# Patient Record
Sex: Female | Born: 1985
Health system: Southern US, Community
[De-identification: ages and names within clinical notes are randomized; demographics above are authoritative.]

## PROBLEM LIST (undated history)

## (undated) DIAGNOSIS — J9801 Acute bronchospasm: Secondary | ICD-10-CM

## (undated) DIAGNOSIS — T7840XA Allergy, unspecified, initial encounter: Secondary | ICD-10-CM

## (undated) DIAGNOSIS — F329 Major depressive disorder, single episode, unspecified: Secondary | ICD-10-CM

## (undated) DIAGNOSIS — F32A Depression, unspecified: Secondary | ICD-10-CM

## (undated) HISTORY — PX: WISDOM TOOTH EXTRACTION: SHX21

## (undated) HISTORY — DX: Depression, unspecified: F32.A

## (undated) HISTORY — DX: Allergy, unspecified, initial encounter: T78.40XA

## (undated) HISTORY — DX: Acute bronchospasm: J98.01

## (undated) HISTORY — DX: Major depressive disorder, single episode, unspecified: F32.9

---

## 2009-07-04 ENCOUNTER — Ambulatory Visit (HOSPITAL_COMMUNITY): Admission: RE | Admit: 2009-07-04 | Discharge: 2009-07-04 | Payer: Self-pay | Admitting: Family Medicine

## 2011-03-06 ENCOUNTER — Ambulatory Visit (INDEPENDENT_AMBULATORY_CARE_PROVIDER_SITE_OTHER): Payer: 59

## 2011-03-06 DIAGNOSIS — Z309 Encounter for contraceptive management, unspecified: Secondary | ICD-10-CM

## 2011-03-20 ENCOUNTER — Encounter: Payer: Self-pay | Admitting: Physician Assistant

## 2011-04-20 ENCOUNTER — Ambulatory Visit (INDEPENDENT_AMBULATORY_CARE_PROVIDER_SITE_OTHER): Payer: 59 | Admitting: Physician Assistant

## 2011-04-20 ENCOUNTER — Encounter: Payer: Self-pay | Admitting: Physician Assistant

## 2011-04-20 VITALS — BP 127/73 | HR 71 | Temp 98.8°F

## 2011-04-20 DIAGNOSIS — R05 Cough: Secondary | ICD-10-CM

## 2011-04-20 DIAGNOSIS — J9801 Acute bronchospasm: Secondary | ICD-10-CM

## 2011-04-20 DIAGNOSIS — J069 Acute upper respiratory infection, unspecified: Secondary | ICD-10-CM

## 2011-04-20 MED ORDER — ALBUTEROL SULFATE HFA 108 (90 BASE) MCG/ACT IN AERS: 2.0000 | INHALATION_SPRAY | RESPIRATORY_TRACT | Status: DC | PRN

## 2011-04-20 MED ORDER — IPRATROPIUM BROMIDE 0.06 % NA SOLN: 2.0000 | Freq: Four times a day (QID) | NASAL | Status: DC

## 2011-04-20 NOTE — Progress Notes (Signed)
  Subjective:    Patient ID: Sheri Allen, female    DOB: [birth year], 26 y.o.   MRN: [id number]  HPI Sheri Allen c/o uri sx x 6 days with scratchy throat, head congestion, cough.  Cough is worse now with fits of coughing, tightness, productive cough. Has been SOB. No f/c. Social smoker   Review of Systems  Constitutional: Positive for chills. Negative for fever.  HENT: Positive for congestion and rhinorrhea. Negative for sore throat.   Respiratory: Positive for cough and shortness of breath.   Musculoskeletal: Negative for myalgias.       Objective:   Physical Exam  Constitutional: She appears well-developed and well-nourished.  HENT:  Right Ear: Tympanic membrane normal.  Left Ear: Tympanic membrane normal.  Mouth/Throat: Oropharynx is clear and moist.  Cardiovascular: Normal rate and regular rhythm.   Pulmonary/Chest: She has wheezes in the left lower field. She has rhonchi in the left lower field.   Albuterol nebulizer treatment administered. Pt feels relief.  PF increases to 400.       Assessment & Plan:  URI Cough Bronchspasm  Atrovent NS and ProAir called in.  Push fluids and return if productive cough, fever, SOB.

## 2011-04-21 ENCOUNTER — Ambulatory Visit (INDEPENDENT_AMBULATORY_CARE_PROVIDER_SITE_OTHER): Payer: 59 | Admitting: Physician Assistant

## 2011-04-21 ENCOUNTER — Encounter: Payer: Self-pay | Admitting: Physician Assistant

## 2011-04-21 DIAGNOSIS — R05 Cough: Secondary | ICD-10-CM

## 2011-04-21 MED ORDER — ALBUTEROL SULFATE (2.5 MG/3ML) 0.083% IN NEBU: 2.5000 mg | INHALATION_SOLUTION | Freq: Once | RESPIRATORY_TRACT | Status: DC

## 2011-04-21 NOTE — Progress Notes (Signed)
    Patient ID: Sheri Allen MRN: [id number], DOB: 11-17-1985, 26 y.o. Date of Encounter: 04/21/2011, 9:34 AM  Primary Physician: No primary provider on file.  Chief Complaint: No chief complaint on file.   HPI: 26 y.o. year old female presents requesting albuterol neb. She requests this secondary to not picking her albuterol inhaler up yet from the pharmacy. Overall she is feeling better, although she does complain of mild wheezing this morning with the cold air outside. No fever, chills. Social smoker.  No past medical history on file.   Home Meds: Prior to Admission medications   Medication Sig Start Date End Date Taking? Authorizing Provider  albuterol (PROVENTIL HFA;VENTOLIN HFA) 108 (90 BASE) MCG/ACT inhaler Inhale 2 puffs into the lungs every 4 (four) hours as needed for wheezing. 04/20/11 04/19/12  Kennedy Bucker, PA-C  ipratropium (ATROVENT) 0.06 % nasal spray Place 2 sprays into the nose 4 (four) times daily. 04/20/11 04/19/12  Kennedy Bucker, PA-C  medroxyPROGESTERone (DEPO-PROVERA) 150 MG/ML injection Inject 150 mg into the muscle every 3 (three) months.    Historical Provider, MD    Allergies:  Allergies  Allergen Reactions  . Amoxicillin   . Hydrocodone   . Monistat     History   Social History  . Marital Status: Married    Spouse Name: N/A    Number of Children: N/A  . Years of Education: N/A   Occupational History  . Not on file.   Social History Main Topics  . Smoking status: Current Some Day Smoker  . Smokeless tobacco: Not on file  . Alcohol Use: Not on file  . Drug Use: Not on file  . Sexually Active: Not on file   Other Topics Concern  . Not on file   Social History Narrative  . No narrative on file     Review of Systems: Constitutional: negative for chills, fever, night sweats or weight changes Cardiovascular: negative for chest pain or palpitations Respiratory: negative for hemoptysis, or shortness of breath Abdominal: negative for  abdominal pain, nausea, vomiting or diarrhea Dermatological: negative for rash Neurologic: negative for headache   Physical Exam: There were no vitals taken for this visit., There is no height or weight on file to calculate BMI. General: Well developed, well nourished, in no acute distress. Head: Normocephalic, atraumatic, eyes without discharge, sclera non-icteric, nares are congested.  Neck: Supple. No thyromegaly. Full ROM. No lymphadenopathy. Lungs: Mild wheezes bilaterally, without rales, or rhonchi. Breathing is unlabored. Heart: RRR with S1 S2. No murmurs, rubs, or gallops appreciated. Msk:  Strength and tone normal for age. Extremities: No clubbing or cyanosis. No edema. Neuro: Alert and oriented X 3. Moves all extremities spontaneously. CNII-XII grossly in tact. Psych:  Responds to questions appropriately with a normal affect.   S/p Albuterol neb: Feels a little better.    ASSESSMENT AND PLAN:  26 y.o. year old female with URI/cough/bronchospasm -Albuterol neb, declines any further treatment. Feeling much better as the day has progressed. -Mucinex -Tylenol/Motrin prn -Rest/fluids -RTC precautions -RTC 3-5 days if no improvement  Signed, Eula Listen, PA-C 04/21/2011 3:32 PM

## 2011-05-21 ENCOUNTER — Ambulatory Visit (INDEPENDENT_AMBULATORY_CARE_PROVIDER_SITE_OTHER): Payer: 59 | Admitting: Physician Assistant

## 2011-05-21 ENCOUNTER — Encounter: Payer: Self-pay | Admitting: Physician Assistant

## 2011-05-21 VITALS — BP 130/75 | HR 100 | Temp 98.4°F | Resp 16 | Ht 67.0 in | Wt 153.0 lb

## 2011-05-21 DIAGNOSIS — Z309 Encounter for contraceptive management, unspecified: Secondary | ICD-10-CM

## 2011-05-21 MED ORDER — FEXOFENADINE HCL 180 MG PO TABS: 180.0000 mg | ORAL_TABLET | Freq: Every day | ORAL | Status: DC

## 2011-05-21 MED ORDER — ORTHO TRI-CYCLEN (28) 0.18/0.215/0.25 MG-35 MCG PO TABS: 1.0000 | ORAL_TABLET | Freq: Every day | ORAL | Status: AC

## 2011-05-21 NOTE — Progress Notes (Signed)
  Subjective:    Patient ID: Sheri Allen, female    DOB: Nov 27, 1985, 26 y.o.   MRN: [id number]  HPI  Sheri Allen comes in today requesting BCP.  She would like to switch from Depo Provera.  She has been on Depo for > 1 year and states that she has spotted regularly since starting it.  She was on BCP years ago and had no problems with them.  She currently smokes 1/2 ppd.  UTD PAP  Review of Systems  Cardiovascular: Negative for leg swelling.  Genitourinary: Positive for menstrual problem (Spotting).  Neurological: Negative for headaches.       Objective:   Physical Exam  Constitutional: She is oriented to person, place, and time. She appears well-developed and well-nourished.  Neurological: She is alert and oriented to person, place, and time.          Assessment & Plan:  [facility] Planning  Ortho [facility] (brand name) Reviewed BC start F/U with GYN for annual exam

## 2011-08-06 ENCOUNTER — Encounter: Payer: Self-pay | Admitting: Family Medicine

## 2011-08-06 ENCOUNTER — Ambulatory Visit (INDEPENDENT_AMBULATORY_CARE_PROVIDER_SITE_OTHER): Payer: 59 | Admitting: Family Medicine

## 2011-08-06 VITALS — BP 111/73 | HR 75 | Temp 98.6°F | Resp 16

## 2011-08-06 DIAGNOSIS — J40 Bronchitis, not specified as acute or chronic: Secondary | ICD-10-CM

## 2011-08-06 DIAGNOSIS — J209 Acute bronchitis, unspecified: Secondary | ICD-10-CM

## 2011-08-06 MED ORDER — METHYLPREDNISOLONE ACETATE 80 MG/ML IJ SUSP
80.0000 mg | Freq: Once | INTRAMUSCULAR | Status: AC
  Administered 2011-08-06: 80 mg via INTRAMUSCULAR

## 2011-08-06 MED ORDER — PREDNISONE 50 MG PO TABS: ORAL_TABLET | ORAL | Status: AC

## 2011-08-06 MED ORDER — AZITHROMYCIN 250 MG PO TABS: ORAL_TABLET | ORAL | Status: AC

## 2011-08-06 NOTE — Progress Notes (Signed)
  Subjective:    Patient ID: Sheri Allen, female    DOB: December 24, 1985, 26 y.o.   MRN: [id number]  HPI URI Symptoms Onset: 9 days   Description: cough, wheezing, mild increased WOB, rhinorrrhea, nasal congestion, post nasal drip  Modifying factors:  + 1/2 PPD smoker   Symptoms Nasal discharge: yes Fever: no Sore throat: no Cough: yes Wheezing: yes Ear pain: no GI symptoms: no Sick contacts: no  Red Flags  Stiff neck: no Dyspnea: mild Rash: no Swallowing difficulty: no  Sinusitis Risk Factors Headache/face pain: no Double sickening: no tooth pain: no  Allergy Risk Factors Sneezing: yes Itchy scratchy throat: no Seasonal symptoms: no  Flu Risk Factors Headache: no muscle aches: no severe fatigue: no     Review of Systems See HPI, otherwise ROS negative     Objective:   Physical Exam Gen: up in chair, NAD HEENT: NCAT, EOMI, TMs clear bilaterally, +nasal erythema, rhinorrhea bilaterally, + post oropharyngeal erythema  CV: RRR, no murmurs auscultated PULM: good overall air movement, faint wheezes in apices ABD: S/NT/+ bowel sounds  EXT: 2+ peripheral pulses      Assessment & Plan:  Bronchitic flare with underlying asthma. Depomedrol 80 IM x 1. Prednisone. Azithromycin for atypical coverage. Discussed smoking cessation. Resp red flags discussed. Follow up as needed.

## 2011-09-21 ENCOUNTER — Ambulatory Visit (INDEPENDENT_AMBULATORY_CARE_PROVIDER_SITE_OTHER): Payer: 59 | Admitting: Physician Assistant

## 2011-09-21 VITALS — BP 118/72 | HR 80 | Temp 98.6°F | Resp 16

## 2011-09-21 DIAGNOSIS — M25519 Pain in unspecified shoulder: Secondary | ICD-10-CM

## 2011-09-21 MED ORDER — MELOXICAM 15 MG PO TABS: 15.0000 mg | ORAL_TABLET | Freq: Every day | ORAL | Status: DC

## 2011-09-21 MED ORDER — METHOCARBAMOL 750 MG PO TABS: 750.0000 mg | ORAL_TABLET | Freq: Three times a day (TID) | ORAL | Status: AC

## 2011-09-22 ENCOUNTER — Encounter: Payer: Self-pay | Admitting: Physician Assistant

## 2011-09-22 NOTE — Progress Notes (Signed)
  Subjective:    Patient ID: Sheri Allen, female    DOB: 04-18-1985, 26 y.o.   MRN: [id number]  HPI Sheri Allen comes in today c/o left shoulder pain for 1 day.  She states that she went swimming the day before and noticed some discomfort later.  This morning was not able to move much without significant pain.  Noticed tingling in her left arm and tightness of the muscle along top of shoulder.  She was able to reduce the pain by lying down on her back a being still.  She also took Ibuprofen that helped.  She is better now but still notices the discomfort.  No previous shoulder injury.   Review of Systems As noted in HPI, otherwise negative     Objective:   Physical Exam  Constitutional: She is oriented to person, place, and time.  Musculoskeletal:       Left shoulder: She exhibits tenderness, pain and spasm. She exhibits normal range of motion, no bony tenderness, no swelling, no effusion and no deformity.       Arms:      FROM but some discomfort with empty can test. No weakness though  Neurological: She is alert and oriented to person, place, and time.  Reflex Scores:      Bicep reflexes are 2+ on the right side and 2+ on the left side. Skin: Skin is warm.          Assessment & Plan:  Shoulder pain Paraesthesias  Encouraged resting but gentle ROM exercises Mobic and Robaxin Watch for worsening paraesthesias Recheck if worsens

## 2011-12-04 ENCOUNTER — Encounter: Payer: Self-pay | Admitting: Internal Medicine

## 2011-12-04 ENCOUNTER — Ambulatory Visit (INDEPENDENT_AMBULATORY_CARE_PROVIDER_SITE_OTHER): Payer: 59 | Admitting: Internal Medicine

## 2011-12-04 VITALS — BP 110/60 | HR 78 | Temp 98.9°F | Resp 16 | Ht 67.5 in | Wt 156.0 lb

## 2011-12-04 DIAGNOSIS — G4700 Insomnia, unspecified: Secondary | ICD-10-CM

## 2011-12-04 MED ORDER — BUPROPION HCL ER (XL) 150 MG PO TB24: ORAL_TABLET | ORAL | Status: DC

## 2011-12-04 MED ORDER — CLONAZEPAM 0.5 MG PO TABS: 0.5000 mg | ORAL_TABLET | Freq: Every evening | ORAL | Status: DC | PRN

## 2011-12-04 NOTE — Progress Notes (Signed)
  Subjective:    Patient ID: Sheri Allen, female    DOB: [birth year], 26 y.o.   MRN: [id number]  HPIProblems with insomnia that are related to a multitude of  Problems/Stressors Confidential information discussed that doesn't belong in this chart-Home/family Mind races with worry/multiple issues  Family history-both parents substance abusers  Grandfather suicide during prolonged illness   Social history-mom was 67 years old her birth  She left home after high school  Married x7 years  Smoking much more over the last 6 months as a treatment   Review of Systems No past medical problems or medications except for allergy and asthma medications and contraceptives    Objective:   Physical Exam Vital signs stable No acute distress Mood= anxious/tears Thought= stable Judgment= sound No SI      Assessment & Plan:  Problem #1 adjustment reaction with insomnia Meds ordered this encounter  Medications  . clonazePAM (KLONOPIN) 0.5 MG tablet    Sig: Take 1 tablet (0.5 mg total) by mouth at bedtime as needed.    Dispense:  30 tablet    Refill:  0  . buPROPion (WELLBUTRIN XL) 150 MG 24 hr tablet    Sig: One a day for 4 days then 2 tabs a day    Dispense:  60 tablet    Refill:  0   Needs to quit smoking/cessation discussed Referred to Evalina Field Followup 3 weeks

## 2011-12-20 ENCOUNTER — Ambulatory Visit (INDEPENDENT_AMBULATORY_CARE_PROVIDER_SITE_OTHER): Payer: 59 | Admitting: Physician Assistant

## 2011-12-20 VITALS — BP 108/82 | HR 60 | Temp 99.5°F | Resp 16 | Ht 67.0 in | Wt 155.0 lb

## 2011-12-20 DIAGNOSIS — L509 Urticaria, unspecified: Secondary | ICD-10-CM

## 2011-12-20 DIAGNOSIS — M79641 Pain in right hand: Secondary | ICD-10-CM

## 2011-12-20 DIAGNOSIS — M79609 Pain in unspecified limb: Secondary | ICD-10-CM

## 2011-12-20 LAB — POCT SEDIMENTATION RATE: POCT SED RATE: 14 mm/hr (ref 0–22)

## 2011-12-20 MED ORDER — RANITIDINE HCL 300 MG PO TABS: 300.0000 mg | ORAL_TABLET | Freq: Every day | ORAL | Status: DC

## 2011-12-20 NOTE — Progress Notes (Signed)
[address] [place] [address]   Phone [phone]  Subjective:    Patient ID: Sheri Allen, female    DOB: [birth year], 26 y.o.   MRN: [id number]  HPI  Pt presents to clinic with 2 concerns 1- hives for 24h - she has been started on wellbutrin about 2 wks ago and tolerating it well except for constipation.  Pt on Wednesday with colace and miralax for constipation and then again a dose Thursday am - had a bowel movement - then she noticed that she got hives and is worried that she is having an allergic reaction to one of the meds for constipation but is unsure which one.  She is on daily allegra and that has not helped that much, benadryl does not typically help with her itching.  They are on her elbows and knees.  2- over the last 2 wks or so she has noticed pain in her R 3rd and 4th digit with gripping things and now the pain is in her wrists with gripping.  The pain is in her joints not muscle or bones.  She has no pain without use of hand.  She has had no injury or change in use of hands that she knows of.  She is right hand dominant.  Some paresthesias in her 3rd and 4th digit with gripping and weakness but she thinks that is related to her pain.  She has used no meds for her pain.  She has started to use her L hand more at work because of the pain.  Review of Systems  Gastrointestinal: Positive for constipation.  Musculoskeletal: Positive for arthralgias.  Skin: Positive for rash.       Objective:   Physical Exam  Vitals reviewed. Constitutional: She is oriented to person, place, and time. She appears well-developed and well-nourished.  HENT:  Head: Normocephalic and atraumatic.  Right Ear: External ear normal.  Left Ear: External ear normal.  Pulmonary/Chest: Effort normal.  Musculoskeletal:       Right hand: She exhibits tenderness (R ulnar wrist). She exhibits normal range of motion and no swelling. normal sensation noted. Decreased strength (only in hand grip with  wrist full flexion) noted. She exhibits no finger abduction and no thumb/finger opposition.       No pain with gripping in full flexion, most pain in fingers with wrist in neutral position and less in extension.  No swelling noticed.  Neurological: She is alert and oriented to person, place, and time.  Skin: Skin is warm and dry. Rash noted. Rash is urticarial.       Rash on elbows and knees - few in number not confluent  Psychiatric: She has a normal mood and affect. Her behavior is normal. Judgment and thought content normal.    Results for orders placed in visit on 12/20/11  POCT SEDIMENTATION RATE      Component Value Range   POCT SED RATE 14  0 - 22 mm/hr         Assessment & Plan:   1. Hives  ranitidine (ZANTAC) 300 MG tablet  2. Hand pain, right  POCT SEDIMENTATION RATE   1- ? Hives from colace or miralax - though uncommon side effect of both most likely is the miralax.  So patient will stop both meds and if constipation returns she will start back only on colace - if hives return she will stop that and she will do the same with the miralax.  If the hives  do not stop we have to think about wellbutrin being the cause. 2- ? Cause but most likely overuse esp with a normal sed rate to help r/o rheumatoid disease (no family hx - but patient states mom is adopted and her family does not really get medical care so she is not really sure of her family medical hx).  Pt to use OTC nsaids and see if she gets improvement - b/c the pain seems more in her fingers so we will not do a splint today - but if pain continues will need xray.  Pt agrees with plan.

## 2011-12-21 ENCOUNTER — Ambulatory Visit (INDEPENDENT_AMBULATORY_CARE_PROVIDER_SITE_OTHER): Payer: 59 | Admitting: Physician Assistant

## 2011-12-21 DIAGNOSIS — L299 Pruritus, unspecified: Secondary | ICD-10-CM

## 2011-12-21 MED ORDER — PREDNISOLONE 15 MG/5ML PO SOLN
30.0000 mg | Freq: Every day | ORAL | Status: DC
  Administered 2011-12-21: 30 mg via ORAL

## 2011-12-21 NOTE — Progress Notes (Signed)
Patient comes to me requesting Prelone for pruritic rash secondary starting Wellbutrin. She was hoping to avoid a steroid but she does have a long shift at work today and is looking for some help. She is on Allegra daily, and this has not helped. Benadryl typically does not help with her pruritis. Rash is localized on her elbows and knees. Ok to 30 mg of Prelone. Should rash persist or worsen patient if to notify me.   Eula Listen, PA-C 12/21/2011 9:21 AM

## 2012-01-01 ENCOUNTER — Telehealth: Payer: Self-pay | Admitting: Family Medicine

## 2012-01-01 NOTE — Telephone Encounter (Signed)
patient requests refill on Wellbutrin 300mg  [facility] to [facility]. Has F/U appt with Dr. Merla Riches 11/13. Will run out this Sunday.

## 2012-01-02 NOTE — Telephone Encounter (Signed)
Does the patient want to change to the 300 mg tablet or continue on 2 of the 150 mg tabs?

## 2012-01-02 NOTE — Telephone Encounter (Signed)
She would prefer one 300 mg tab.

## 2012-01-03 MED ORDER — BUPROPION HCL ER (XL) 300 MG PO TB24: ORAL_TABLET | ORAL | Status: DC

## 2012-01-03 NOTE — Telephone Encounter (Signed)
Done

## 2012-01-04 NOTE — Telephone Encounter (Signed)
patient notified and voiced understanding. 

## 2012-01-15 ENCOUNTER — Ambulatory Visit (INDEPENDENT_AMBULATORY_CARE_PROVIDER_SITE_OTHER): Payer: 59 | Admitting: Internal Medicine

## 2012-01-15 ENCOUNTER — Encounter: Payer: Self-pay | Admitting: Internal Medicine

## 2012-01-15 VITALS — BP 110/60 | HR 67 | Temp 98.6°F | Resp 16 | Ht 67.5 in | Wt 159.6 lb

## 2012-01-15 DIAGNOSIS — F4320 Adjustment disorder, unspecified: Secondary | ICD-10-CM

## 2012-01-15 DIAGNOSIS — G4700 Insomnia, unspecified: Secondary | ICD-10-CM

## 2012-01-15 MED ORDER — CLONAZEPAM 0.5 MG PO TABS: 0.5000 mg | ORAL_TABLET | Freq: Every evening | ORAL | Status: DC | PRN

## 2012-01-16 DIAGNOSIS — G4700 Insomnia, unspecified: Secondary | ICD-10-CM | POA: Insufficient documentation

## 2012-01-16 DIAGNOSIS — F4320 Adjustment disorder, unspecified: Secondary | ICD-10-CM | POA: Insufficient documentation

## 2012-01-16 NOTE — Progress Notes (Signed)
  Subjective:    Patient ID: Sheri Allen, female    DOB: 1985-11-21, 26 y.o.   MRN: [id number]  HPIFollowup from last visit Remarkably good response to both medications Now needs Clonopin once or twice a week Wellbutrin has no side effects and seems to make mood stable throughout the day with less anger Still hasn't started counseling    Review of Systems     Objective:   Physical Exam Vital signs stable Mood and affect good       Assessment & Plan:   1. Insomnia   2. Adjustment reaction    Meds ordered this encounter  Medications  . clonazePAM (KLONOPIN) 0.5 MG tablet    Sig: Take 1 tablet (0.5 mg total) by mouth at bedtime as needed.    Dispense:  30 tablet    Refill:  5   Continue Wellbutrin 300 XL  Set up appointment for therapy Dr. Evalina Field  Recheck 3-6 months

## 2012-04-17 ENCOUNTER — Telehealth: Payer: Self-pay

## 2012-04-17 MED ORDER — FEXOFENADINE HCL 180 MG PO TABS: 180.0000 mg | ORAL_TABLET | Freq: Every day | ORAL | Status: DC

## 2012-04-17 NOTE — Telephone Encounter (Signed)
Patient needs a refill of allegra sent to [facility]

## 2012-04-17 NOTE — Telephone Encounter (Signed)
Done

## 2012-05-25 ENCOUNTER — Ambulatory Visit (INDEPENDENT_AMBULATORY_CARE_PROVIDER_SITE_OTHER): Payer: 59 | Admitting: Physician Assistant

## 2012-05-25 VITALS — BP 96/64 | HR 120 | Temp 97.8°F | Resp 16 | Ht 67.5 in | Wt 146.0 lb

## 2012-05-25 DIAGNOSIS — R51 Headache: Secondary | ICD-10-CM

## 2012-05-25 DIAGNOSIS — R112 Nausea with vomiting, unspecified: Secondary | ICD-10-CM

## 2012-05-25 DIAGNOSIS — E860 Dehydration: Secondary | ICD-10-CM

## 2012-05-25 MED ORDER — ONDANSETRON 4 MG PO TBDP: 4.0000 mg | ORAL_TABLET | Freq: Three times a day (TID) | ORAL | Status: DC | PRN

## 2012-05-25 MED ORDER — IBUPROFEN 200 MG PO TABS
400.0000 mg | ORAL_TABLET | Freq: Once | ORAL | Status: AC
  Administered 2012-05-25: 400 mg via ORAL

## 2012-05-25 MED ORDER — ONDANSETRON 4 MG PO TBDP
8.0000 mg | ORAL_TABLET | Freq: Once | ORAL | Status: AC
  Administered 2012-05-25: 8 mg via ORAL

## 2012-05-25 NOTE — Progress Notes (Signed)
   [address] [place] [address]   Phone [phone]  Subjective:    Patient ID: Sheri Allen, female    DOB: 1985/11/03, 27 y.o.   MRN: [id number]  HPI Pt presents to clinic with 4 hr h/o vomiting.  She has myalgias and feels terrible.  She feels like she might have diarrhea but not yet.  A lot of abd cramping.  No urinary symptoms.   Review of Systems  Constitutional: Negative for chills.  Gastrointestinal: Positive for nausea, vomiting and abdominal pain (cramping).  Genitourinary: Negative.   Neurological: Positive for dizziness (with change in position).       Objective:   Physical Exam  Vitals reviewed. Constitutional: She is oriented to person, place, and time. She appears well-developed and well-nourished.  Pale.  Curled up in a ball on the exam table.  HENT:  Head: Normocephalic and atraumatic.  Right Ear: External ear normal.  Left Ear: External ear normal.  Eyes: Conjunctivae are normal.  Cardiovascular: Normal rate, regular rhythm and normal heart sounds.   No murmur heard. Pulmonary/Chest: Effort normal and breath sounds normal.  Abdominal: Soft. Tenderness: generalized discomfort.  Neurological: She is alert and oriented to person, place, and time.  Skin: Skin is warm and dry.  Psychiatric: She has a normal mood and affect. Her behavior is normal. Judgment and thought content normal.   Pt feels better after Zofran. After 2 L of fluids - pt feels better still has a terrible HA was has urinated and urine is much lighter.  Abd cramping is better. She tolerating some fluids and tried Motrin 400mg  in office.      Assessment & Plan:  Nausea with vomiting and dehydration - pt experiencing a Gi illness- She feels better after 2 L IV fluids.  She is to go home push fluids and advance diet as tolerated.  Plan: ondansetron (ZOFRAN-ODT) disintegrating tablet 8 mg, ondansetron (ZOFRAN ODT) 4 MG disintegrating tablet  Headache -2nd to dehydration.  Plan: ibuprofen  (ADVIL,MOTRIN) tablet 400 mg -- expect this to improve as she become hydrated.

## 2012-06-05 ENCOUNTER — Ambulatory Visit (INDEPENDENT_AMBULATORY_CARE_PROVIDER_SITE_OTHER): Payer: 59 | Admitting: Emergency Medicine

## 2012-06-05 ENCOUNTER — Ambulatory Visit: Payer: 59

## 2012-06-05 VITALS — BP 98/68 | HR 80 | Temp 98.7°F | Resp 16 | Ht 67.5 in | Wt 146.0 lb

## 2012-06-05 DIAGNOSIS — R109 Unspecified abdominal pain: Secondary | ICD-10-CM

## 2012-06-05 DIAGNOSIS — K5900 Constipation, unspecified: Secondary | ICD-10-CM

## 2012-06-05 NOTE — Progress Notes (Signed)
Subjective:    Patient ID: Sheri Allen, female    DOB: 02-17-1986, 27 y.o.   MRN: [id number]  HPI  This 27 y.o. female presents for evaluation of constipation.  Symptoms began after taking ondansetron for a recent GI illness.  One week ago took a total of 9 doses of Miralax over 24 hours, with very minimal results.  Has developed some nausea, but no vomiting.  Has crampy abdominal pain and bloating.  Has the urge to defecate but is unable to.  Feels like there is a ball of stool at the rectum.  Reports she drinks 64 ounces of water a day.  Has increased her dietary fiber. 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   History reviewed. No pertinent past medical history.  History reviewed. No pertinent past surgical history.  Prior to Admission medications   Medication Sig Start Date End Date Taking? Authorizing Provider  buPROPion (WELLBUTRIN XL) 300 MG 24 hr tablet One a day for 4 days then 2 tabs a day 01/03/12  Yes Kaelen Brennan S Danity Schmelzer, PA-C  clonazePAM (KLONOPIN) 0.5 MG tablet Take 1 tablet (0.5 mg total) by mouth at bedtime as needed. 01/15/12  Yes Tonye Pearson, MD  fexofenadine (ALLEGRA) 180 MG tablet Take 1 tablet (180 mg total) by mouth daily. 04/17/12  Yes Morrell Riddle, PA-C  montelukast (SINGULAIR) 10 MG tablet Take 10 mg by mouth at bedtime.   Yes Historical Provider, MD  norgestimate-ethinyl estradiol (ORTHO-CYCLEN,SPRINTEC,PREVIFEM) 0.25-35 MG-MCG tablet Take 1 tablet by mouth daily.   Yes Historical Provider, MD  albuterol (PROVENTIL HFA;VENTOLIN HFA) 108 (90 BASE) MCG/ACT inhaler Inhale 2 puffs into the lungs every 4 (four) hours as needed for wheezing. 04/20/11 04/19/12  Pattricia Boss, PA-C  ipratropium (ATROVENT) 0.06 % nasal spray Place 2 sprays into the nose 4 (four) times daily. 04/20/11 04/19/12  Pattricia Boss, PA-C    Allergies  Allergen Reactions  . Amoxicillin Hives  . Hydrocodone Nausea And Vomiting  . Miconazole Nitrate Hives    History   Social  History  . Marital Status: Married    Spouse Name: Eli    Number of Children: 0  . Years of Education: 14   Occupational History  . CMA [employer]   Social History Main Topics  . Smoking status: Current Every Day Smoker -- 0.50 packs/day  . Smokeless tobacco: Never Used  . Alcohol Use: Yes     Comment: ocasionally  . Drug Use: No  . Sexually Active: Yes -- Female partner(s)    Birth Control/ Protection: Pill   Other Topics Concern  . Not on file   Social History Narrative   Lives with her husband.  Her family lives in [place], [place]    Family History  Problem Relation Age of Onset  . Hyperlipidemia Paternal Grandmother   . Hypertension Paternal Grandmother      Review of Systems As above.    Objective:   Physical Exam  Blood pressure 98/68, pulse 80, temperature 98.7 F (37.1 C), temperature source Oral, resp. rate 16, height 5' 7.5" (1.715 m), weight 146 lb (66.225 kg), last menstrual period 05/20/2012. Body mass index is 22.52 kg/(m^2). Well-developed, well nourished WF who is awake, alert and oriented, in NAD. HEENT: [place]/AT, sclera and conjunctiva are clear.   Neck: supple, non-tender, no lymphadenopathy, thyromegaly. Heart: RRR, no murmur Lungs: normal effort, CTA Abdomen: normo-active bowel sounds, supple, no organomegaly. Palpable stool in the descending colon. Very  tender.  No guarding, referred tenderness or rebound tenderness. Extremities: no cyanosis, clubbing or edema. Skin: warm and dry without rash. Psychologic: good mood and appropriate affect, normal speech and behavior.  On rectal exam there is thick moderately soft stool in the upper vault and above.  It was moved into the lower rectum digitally.  [facility] reading (PRIMARY) by  Dr. Cleta Alberts.  Large stool in the colon.  Constipation.  No free air. No ileus.      Assessment & Plan:  Abdominal pain - Plan: DG Abd Acute W/Chest  Unspecified constipation  Patient Instructions  Use a Fleet's enema and  drink 1 bottle of magnesium citrate tonight. If you do not have significant results overnite, repeat the magnesium citrate and enema in the morning. If you still do not have results, use Miralax, 10 doses in 64 ounces of non-carbonated liquid, Saturday evening and repeat the enema. If you still do not have results, repeat the enema Sunday morning.  Repeat the enema every 8-12 hours until you have results.   Plan re-evaluation on Monday unless you are WELL, sooner if you develop fever, vomiting, or other symptoms that concern you.   Fernande Bras, PA-C Physician Assistant-Certified [facility]

## 2012-06-05 NOTE — Patient Instructions (Signed)
Use a Fleet's enema and drink 1 bottle of magnesium citrate tonight. If you do not have significant results overnite, repeat the magnesium citrate and enema in the morning. If you still do not have results, use Miralax, 10 doses in 64 ounces of non-carbonated liquid, Saturday evening and repeat the enema. If you still do not have results, repeat the enema Sunday morning.  Repeat the enema every 8-12 hours until you have results.   Plan re-evaluation on Monday unless you are WELL, sooner if you develop fever, vomiting, or other symptoms that concern you.

## 2012-06-08 ENCOUNTER — Ambulatory Visit (INDEPENDENT_AMBULATORY_CARE_PROVIDER_SITE_OTHER): Payer: 59 | Admitting: Emergency Medicine

## 2012-06-08 ENCOUNTER — Ambulatory Visit: Payer: 59

## 2012-06-08 VITALS — BP 122/58 | HR 109 | Temp 98.6°F | Resp 18 | Ht 67.5 in | Wt 148.0 lb

## 2012-06-08 DIAGNOSIS — R109 Unspecified abdominal pain: Secondary | ICD-10-CM

## 2012-06-08 DIAGNOSIS — K5900 Constipation, unspecified: Secondary | ICD-10-CM

## 2012-06-08 NOTE — Progress Notes (Signed)
  Subjective:    Patient ID: Sheri Allen, female    DOB: April 26, 1985, 27 y.o.   MRN: [id number]  HPI This 27 y.o. female presents for evaluation of constipation.  See previous note of 06/05/2012.  She has some liquid stool on 4/04 and 4/05, but very little since then.  Still feels the lump on the LEFT flank, thought to be stool.  Mild nausea.  No vomiting.  No fever, chills.  She took 4 doses of Miralax yesterday.  Has increased her dietary fiber and oral fluids.  The magnesium citrate seemed to help some on 4/04, but she had increased nausea when she tried it again.  PMH, SurgHx, Soc Hx, FHx, current medications, allergies and problem list reviewed and updated.  Review of Systems As above.    Objective:   Physical Exam  Blood pressure 122/58, pulse 109, temperature 98.6 F (37 C), resp. rate 18, height 5' 7.5" (1.715 m), weight 148 lb (67.132 kg), last menstrual period 05/20/2012, SpO2 100.00%. Body mass index is 22.82 kg/(m^2). Well-developed, well nourished WF who is awake, alert and oriented, in NAD. HEENT: [place]/AT, sclera and conjunctiva are clear.   Neck: supple, non-tender, no lymphadenopathy, thyromegaly. Heart: RRR, no murmur Lungs: normal effort, CTA Abdomen: normo-active bowel sounds, supple, no mass or organomegaly. Diffuse tenderness, worst in the LUQ, where there is what feels like hard stool in the descending colon. Extremities: no cyanosis, clubbing or edema. Skin: warm and dry without rash. Psychologic: good mood and appropriate affect, normal speech and behavior.  Single View Upright Abdominal film: [facility] reading (PRIMARY) by  Dr. Cleta Alberts.  Large stool in the colon, denser in the descending colon, but less dense than 06/05/2012.     Assessment & Plan:  Abdominal pain - Plan: DG Abd 1 View  Unspecified constipation  Continue with her current efforts with hydration, 4 doses of Miralax, dietary fiber.  Enema at night. If no improvement in 48-72 hours, or if worsens, plan  complete abdominal US.  Fernande Bras, PA-C Physician Assistant-Certified [facility]

## 2012-06-08 NOTE — Patient Instructions (Signed)
Drink at least 64 ounces of water each day. Take 4 doses of Miralax daily. Use an enema each evening. Go for a jog. If your discomfort worsens, we'll proceed with an ultrasound. If your have not had additional improvement in 48-72 hours, we'll proceed with an [place].

## 2012-07-08 ENCOUNTER — Ambulatory Visit (INDEPENDENT_AMBULATORY_CARE_PROVIDER_SITE_OTHER): Payer: 59 | Admitting: Internal Medicine

## 2012-07-08 ENCOUNTER — Encounter: Payer: Self-pay | Admitting: Internal Medicine

## 2012-07-08 VITALS — BP 111/69 | HR 84 | Temp 98.1°F | Resp 18 | Wt 148.0 lb

## 2012-07-08 DIAGNOSIS — G4700 Insomnia, unspecified: Secondary | ICD-10-CM

## 2012-07-08 DIAGNOSIS — F4320 Adjustment disorder, unspecified: Secondary | ICD-10-CM

## 2012-07-08 MED ORDER — FLUOXETINE HCL 20 MG PO TABS: 20.0000 mg | ORAL_TABLET | Freq: Every day | ORAL | Status: DC

## 2012-07-08 NOTE — Progress Notes (Signed)
F/u Patient Active Problem List   Diagnosis Date Noted  . Insomnia 01/16/2012  . Adjustment reaction 01/16/2012   Did well w/ meds as noted then over last 5 weeks stopped wking But also lots of chges and stress from all of this\lots of avoid. Of future ques like marr 7 yr husb wants kids but "no" cause avoids life planning to get self ready for transition//also procras re job Other issues avoided relat great  40 min disc with no need to chronicale due to her wk here  IMP=Same  Plan=d/c well btr and start: Meds ordered this encounter  Medications  . FLUoxetine (PROZAC) 20 MG tablet    Sig: Take 1 tablet (20 mg total) by mouth daily.    Dispense:  30 tablet    Refill:  3  letters to husb Eli Prior list to get ready for responsibility 2 names couns-young/michelich---GET STARTED F/u 32mo

## 2012-07-20 ENCOUNTER — Other Ambulatory Visit: Payer: Self-pay | Admitting: Radiology

## 2012-07-20 NOTE — Telephone Encounter (Signed)
Patient requesting Rx for Diflucan she denies pregnancy, please advise. pended

## 2012-07-21 MED ORDER — FLUCONAZOLE 150 MG PO TABS: 150.0000 mg | ORAL_TABLET | Freq: Once | ORAL | Status: DC

## 2012-07-22 ENCOUNTER — Other Ambulatory Visit: Payer: Self-pay | Admitting: Internal Medicine

## 2012-08-12 ENCOUNTER — Ambulatory Visit: Payer: 59 | Admitting: Internal Medicine

## 2012-10-12 ENCOUNTER — Ambulatory Visit (INDEPENDENT_AMBULATORY_CARE_PROVIDER_SITE_OTHER): Payer: 59 | Admitting: Physician Assistant

## 2012-10-12 ENCOUNTER — Encounter: Payer: Self-pay | Admitting: Physician Assistant

## 2012-10-12 VITALS — BP 118/70 | HR 90 | Temp 98.9°F | Resp 16 | Ht 67.0 in | Wt 154.0 lb

## 2012-10-12 DIAGNOSIS — J069 Acute upper respiratory infection, unspecified: Secondary | ICD-10-CM

## 2012-10-12 DIAGNOSIS — R05 Cough: Secondary | ICD-10-CM

## 2012-10-12 DIAGNOSIS — J309 Allergic rhinitis, unspecified: Secondary | ICD-10-CM

## 2012-10-12 MED ORDER — ALBUTEROL SULFATE HFA 108 (90 BASE) MCG/ACT IN AERS: 2.0000 | INHALATION_SPRAY | RESPIRATORY_TRACT | Status: DC | PRN

## 2012-10-12 MED ORDER — MONTELUKAST SODIUM 10 MG PO TABS: 10.0000 mg | ORAL_TABLET | Freq: Every day | ORAL | Status: DC

## 2012-10-12 MED ORDER — CLARITHROMYCIN ER 500 MG PO TB24: 1000.0000 mg | ORAL_TABLET | Freq: Every day | ORAL | Status: DC

## 2012-10-12 MED ORDER — GUAIFENESIN ER 1200 MG PO TB12: 1.0000 | ORAL_TABLET | Freq: Two times a day (BID) | ORAL | Status: DC | PRN

## 2012-10-12 MED ORDER — MOMETASONE FUROATE 50 MCG/ACT NA SUSP: 2.0000 | Freq: Every day | NASAL | Status: DC

## 2012-10-12 MED ORDER — TRIAMCINOLONE ACETONIDE(NASAL) 55 MCG/ACT NA INHA: 2.0000 | Freq: Every day | NASAL | Status: DC

## 2012-10-12 NOTE — Progress Notes (Signed)
  Subjective:    Patient ID: Sheri Allen, female    DOB: October 29, 1985, 27 y.o.   MRN: [id number]  HPI This 27 y.o. female presents for evaluation of sinus pressure, congestion and drainage x 17 days. Cough. Symptoms began about the time she ran out of Singulair.  She takes Allegra daily for the treatment of self diagnosed allergic rhinitis and notes that whenever she tries to stop it "I get sick.  And it always starts with post-nasal drainage."  She was using samples of Singulair that a friend gave her, and intermittently uses Nasonex (samples provided by the same friend), though she has never had prescriptions for these medications.  She has previously used Flonase, but did not tolerate it.  Patient Active Problem List   Diagnosis Date Noted  . Insomnia 01/16/2012  . Adjustment reaction 01/16/2012   Current Outpatient Prescriptions on File Prior to Visit  Medication Sig Dispense Refill  . clonazePAM (KLONOPIN) 0.5 MG tablet Take 1 tablet (0.5 mg total) by mouth at bedtime as needed.  30 tablet  5  . fexofenadine (ALLEGRA) 180 MG tablet Take 1 tablet (180 mg total) by mouth daily.  90 tablet  3  . norgestimate-ethinyl estradiol (ORTHO-CYCLEN,SPRINTEC,PREVIFEM) 0.25-35 MG-MCG tablet Take 1 tablet by mouth daily.      . montelukast (SINGULAIR) 10 MG tablet Take 10 mg by mouth at bedtime.       No current facility-administered medications on file prior to visit.   Allergies  Allergen Reactions  . Amoxicillin Hives  . Flonase (Fluticasone)   . Hydrocodone Nausea And Vomiting  . Miconazole Nitrate Hives   Past medical history, surgical history, family history, social history and problem list reviewed.     Review of Systems As above.  No fever, chills, GI/GU symptoms.      Objective:   Physical Exam Blood pressure 118/70, pulse 90, temperature 98.9 F (37.2 C), temperature source Oral, resp. rate 16, height 5\' 7"  (1.702 m), weight 154 lb (69.854 kg), last menstrual period 10/07/2012,  SpO2 99.00%. Body mass index is 24.11 kg/(m^2). Well-developed, well nourished WF who is awake, alert and oriented, in NAD. HEENT: [place]/AT, PERRL, EOMI.  Sclera and conjunctiva are clear.  EAC are patent, TMs are normal in appearance. Nasal mucosa is pink and moist. OP is clear. Mild sinus tenderness (maxillary) Neck: supple, non-tender, no lymphadenopathy, thyromegaly. Heart: RRR, no murmur Lungs: normal effort, CTA Extremities: no cyanosis, clubbing or edema. Skin: warm and dry without rash. Psychologic: good mood and appropriate affect, normal speech and behavior.        Assessment & Plan:  Cough due to sinusitis secondary to chronic allergic rhinitis.  URI (upper respiratory infection) - Plan: albuterol (PROVENTIL HFA;VENTOLIN HFA) 108 (90 BASE) MCG/ACT inhaler, Guaifenesin (MUCINEX MAXIMUM STRENGTH) 1200 MG TB12, clarithromycin (BIAXIN XL) 500 MG 24 hr tablet  Allergic rhinitis - Plan: montelukast (SINGULAIR) 10 MG tablet, triamcinolone (NASACORT) 55 MCG/ACT nasal inhaler, DISCONTINUED: mometasone (NASONEX) 50 MCG/ACT nasal spray (insurance doesn't cover this product, changed to Nasacort)  Quit smoking.  Reduce allergen exposure in home/bedroom. Anticipatory guidance.  Supportive care.  Fernande Bras, PA-C Physician Assistant-Certified [facility]

## 2012-10-12 NOTE — Patient Instructions (Signed)
Get plenty of rest and drink at least 64 ounces of water daily. When your symptoms are resolved, you may stop the Atrovent nasal spray, but continue the Nasonex. Wash your bedlinens in HOT water weekly. Vacuum the carpet in your bedroom 2-3 times weekly. Consider replacing the carpet in your bedroom with hardwood.

## 2012-11-24 ENCOUNTER — Other Ambulatory Visit: Payer: Self-pay | Admitting: Physician Assistant

## 2012-11-24 MED ORDER — FLUCONAZOLE 150 MG PO TABS: 150.0000 mg | ORAL_TABLET | Freq: Once | ORAL | Status: DC

## 2013-01-07 ENCOUNTER — Other Ambulatory Visit: Payer: Self-pay

## 2013-02-18 ENCOUNTER — Ambulatory Visit (INDEPENDENT_AMBULATORY_CARE_PROVIDER_SITE_OTHER): Payer: 59 | Admitting: Family Medicine

## 2013-02-18 VITALS — BP 120/80 | HR 77 | Temp 98.2°F | Resp 18 | Ht 67.5 in | Wt 153.0 lb

## 2013-02-18 DIAGNOSIS — R062 Wheezing: Secondary | ICD-10-CM

## 2013-02-18 LAB — POCT CBC
Granulocyte percent: 52.6 %G (ref 37–80)
HCT, POC: 37.1 % — AB (ref 37.7–47.9)
Hemoglobin: 11.6 g/dL — AB (ref 12.2–16.2)
MCHC: 31.3 g/dL — AB (ref 31.8–35.4)
MPV: 9.6 fL (ref 0–99.8)
POC Granulocyte: 3.4 (ref 2–6.9)
Platelet Count, POC: 221 10*3/uL (ref 142–424)
RDW, POC: 13.5 %
WBC: 6.5 10*3/uL (ref 4.6–10.2)

## 2013-02-18 MED ORDER — PREDNISONE 20 MG PO TABS: ORAL_TABLET | ORAL | Status: DC

## 2013-02-18 MED ORDER — ALBUTEROL SULFATE (2.5 MG/3ML) 0.083% IN NEBU
2.5000 mg | INHALATION_SOLUTION | Freq: Once | RESPIRATORY_TRACT | Status: AC
  Administered 2013-02-18: 2.5 mg via RESPIRATORY_TRACT

## 2013-02-18 MED ORDER — DOXYCYCLINE HYCLATE 100 MG PO TABS: 100.0000 mg | ORAL_TABLET | Freq: Two times a day (BID) | ORAL | Status: DC

## 2013-02-18 MED ORDER — IPRATROPIUM BROMIDE 0.03 % NA SOLN: 2.0000 | Freq: Three times a day (TID) | NASAL | Status: DC

## 2013-02-18 NOTE — Progress Notes (Signed)
[facility] [address], [place] [address] [phone]  Date:  02/18/2013   Name:  Sheri Allen   DOB:  [birth year]   MRN:  [id number]  PCP:  Tonye Pearson, MD    Chief Complaint: Cough, Shortness of Breath and Wheezing   History of Present Illness:  Sheri Allen is a 27 y.o. very pleasant female patient who presents with the following:  She has a history of allergies and RAD.  She has noted illness for about 6 days. She has noted a "nasty tasting" cough- she has had producitve cough nearly a week.  She is using her albuterol regularly and it helps temporarily.  This reminds her of past RAD exacerbations which have been make dramatically better by albuterol.    She has not noted a fever.  She does not feel toxic, has been a little tired.  No aches.   No GI sx, no ST.  She does have some sinus congestion  LMP 11/27 Admits that she still smokes about a 1/2 ppd but does plan to quit soon.    Patient Active Problem List   Diagnosis Date Noted  . Insomnia 01/16/2012  . Adjustment reaction 01/16/2012    Past Medical History  Diagnosis Date  . Allergy   . Bronchospasm   . Depression     Past Surgical History  Procedure Laterality Date  . Wisdom tooth extraction      History  Substance Use Topics  . Smoking status: Current Every Day Smoker -- 0.50 packs/day  . Smokeless tobacco: Never Used  . Alcohol Use: Yes     Comment: ocasionally    Family History  Problem Relation Age of Onset  . Hyperlipidemia Paternal Grandmother   . Hypertension Paternal Grandmother     Allergies  Allergen Reactions  . Amoxicillin Hives  . Flonase [Fluticasone]   . Hydrocodone Nausea And Vomiting  . Miconazole Nitrate Hives    Medication list has been reviewed and updated.  Current Outpatient Prescriptions on File Prior to Visit  Medication Sig Dispense Refill  . albuterol (PROVENTIL HFA;VENTOLIN HFA) 108 (90 BASE) MCG/ACT inhaler Inhale 2  puffs into the lungs every 4 (four) hours as needed for wheezing or shortness of breath.      . fexofenadine (ALLEGRA) 180 MG tablet Take 1 tablet (180 mg total) by mouth daily.  90 tablet  3  . Guaifenesin (MUCINEX MAXIMUM STRENGTH) 1200 MG TB12 Take 1 tablet (1,200 mg total) by mouth every 12 (twelve) hours as needed.  14 tablet  1  . montelukast (SINGULAIR) 10 MG tablet Take 10 mg by mouth at bedtime.      . norgestimate-ethinyl estradiol (ORTHO-CYCLEN,SPRINTEC,PREVIFEM) 0.25-35 MG-MCG tablet Take 1 tablet by mouth daily.      . clonazePAM (KLONOPIN) 0.5 MG tablet Take 1 tablet (0.5 mg total) by mouth at bedtime as needed.  30 tablet  5  . ipratropium (ATROVENT) 0.03 % nasal spray Place 2 sprays into the nose every 12 (twelve) hours.       No current facility-administered medications on file prior to visit.    Review of Systems:  As per HPI- otherwise negative.   Physical Examination: Filed Vitals:   02/18/13 2013  BP: 120/80  Pulse: 77  Temp: 98.2 F (36.8 C)  Resp: 18   Filed Vitals:   02/18/13 2013  Height: 5' 7.5" (1.715 m)  Weight: 153 lb (69.4 kg)   Body mass index is 23.6  kg/(m^2). Ideal Body Weight: Weight in (lb) to have BMI = 25: 161.7  GEN: WDWN, NAD, Non-toxic, A & O x 3, looks well HEENT: Atraumatic, Normocephalic. Neck supple. No masses, No LAD.  Bilateral TM wnl, oropharynx normal.  PEERL,EOMI.   Ears and Nose: No external deformity. CV: RRR, No M/G/R. No JVD. No thrill. No extra heart sounds. PULM: CTA B, crackles, rhonchi. No retractions. No resp. distress. No accessory muscle use.  Diffuse wheezes EXTR: No c/c/e NEURO Normal gait.  PSYCH: Normally interactive. Conversant. Not depressed or anxious appearing.  Calm demeanor.   Results for orders placed in visit on 02/18/13  POCT CBC      Result Value Range   WBC 6.5  4.6 - 10.2 K/uL   Lymph, poc 2.6  0.6 - 3.4   POC LYMPH PERCENT 40.3  10 - 50 %L   MID (cbc) 0.5  0 - 0.9   POC MID % 7.1  0 - 12 %M    POC Granulocyte 3.4  2 - 6.9   Granulocyte percent 52.6  37 - 80 %G   RBC 3.99 (*) 4.04 - 5.48 M/uL   Hemoglobin 11.6 (*) 12.2 - 16.2 g/dL   HCT, POC 16.1 (*) 09.6 - 47.9 %   MCV 93.0  80 - 97 fL   MCH, POC 29.1  27 - 31.2 pg   MCHC 31.3 (*) 31.8 - 35.4 g/dL   RDW, POC 04.5     Platelet Count, POC 221  142 - 424 K/uL   MPV 9.6  0 - 99.8 fL   Given albuterol neb- wheezing much better, better air movement.    Assessment and Plan: Wheezing - Plan: POCT CBC, albuterol (PROVENTIL) (2.5 MG/3ML) 0.083% nebulizer solution 2.5 mg, ipratropium (ATROVENT) 0.03 % nasal spray, predniSONE (DELTASONE) 20 MG tablet, doxycycline (VIBRA-TABS) 100 MG tablet  RAD exacerbation, likely related to viral illness.  Refilled atrovent nasal.  Prednisone taper.  Doxycycline rx to hold.  Asked her to please let me know if not feeling better in the next couple of days- Sooner if worse.     Meds ordered this encounter  Medications  . albuterol (PROVENTIL) (2.5 MG/3ML) 0.083% nebulizer solution 2.5 mg    Sig:   . ipratropium (ATROVENT) 0.03 % nasal spray    Sig: Place 2 sprays into the nose 3 (three) times daily.    Dispense:  30 mL    Refill:  11  . predniSONE (DELTASONE) 20 MG tablet    Sig: Take 2 pills a day for 3 days, then 1 pill a day for 3 days    Dispense:  9 tablet    Refill:  0  . doxycycline (VIBRA-TABS) 100 MG tablet    Sig: Take 1 tablet (100 mg total) by mouth 2 (two) times daily.    Dispense:  20 tablet    Refill:  0     Signed Abbe Amsterdam, MD

## 2013-03-10 ENCOUNTER — Ambulatory Visit (INDEPENDENT_AMBULATORY_CARE_PROVIDER_SITE_OTHER): Payer: 59 | Admitting: Internal Medicine

## 2013-03-10 VITALS — BP 114/68 | HR 85 | Temp 98.5°F | Resp 16 | Ht 67.5 in | Wt 162.0 lb

## 2013-03-10 DIAGNOSIS — G4700 Insomnia, unspecified: Secondary | ICD-10-CM

## 2013-03-10 DIAGNOSIS — J019 Acute sinusitis, unspecified: Secondary | ICD-10-CM

## 2013-03-10 DIAGNOSIS — F4320 Adjustment disorder, unspecified: Secondary | ICD-10-CM

## 2013-03-10 MED ORDER — CLONAZEPAM 0.5 MG PO TABS: 0.5000 mg | ORAL_TABLET | Freq: Every evening | ORAL | Status: DC | PRN

## 2013-03-10 MED ORDER — AZITHROMYCIN 500 MG PO TABS: 500.0000 mg | ORAL_TABLET | Freq: Every day | ORAL | Status: DC

## 2013-03-10 MED ORDER — ESCITALOPRAM OXALATE 10 MG PO TABS: 10.0000 mg | ORAL_TABLET | Freq: Every day | ORAL | Status: DC

## 2013-03-10 MED ORDER — PREDNISONE 20 MG PO TABS: ORAL_TABLET | ORAL | Status: DC

## 2013-03-11 NOTE — Progress Notes (Signed)
Followup on prior discussions of adjustment reaction Feels the need to restart medication Lots depression symptoms-unresolved relationship issues/went to one counseling session with C Dowda but failed to return Is not sure how to get started with pertinent discussions to changing life Settled with trouble from childhood with poverty and stepmother  Also describes left frontal dependent headache for 7 days following a flulike illness that lasted for 2-1/2 weeks/no fever visual changes  Exam BP 114/68  Pulse 85  Temp(Src) 98.5 F (36.9 C)  Resp 16  Ht 5' 7.5" (1.715 m)  Wt 162 lb (73.483 kg)  BMI 24.98 kg/m2  SpO2 100%  LMP 01/28/2013  left frontal tenderness to percussion Nares with purulent discharge from the left Throat clear/no nodes  Imp Insomnia  Adjustment reaction  Left frontal sinusitis  Meds ordered this encounter  Medications  . azithromycin (ZITHROMAX) 500 MG tablet    Sig: Take 1 tablet (500 mg total) by mouth daily.    Dispense:  5 tablet    Refill:  0  . predniSONE (DELTASONE) 20 MG tablet    Sig: 3/3/2/2/1/1 single daily dose for 6 days    Dispense:  12 tablet    Refill:  0  . escitalopram (LEXAPRO) 10 MG tablet    Sig: Take 1 tablet (10 mg total) by mouth daily.    Dispense:  30 tablet    Refill:  2  . clonazePAM (KLONOPIN) 0.5 MG tablet    Sig: Take 1 tablet (0.5 mg total) by mouth at bedtime as needed.    Dispense:  30 tablet    Refill:  0   Followup 6 weeks

## 2013-04-28 ENCOUNTER — Ambulatory Visit (INDEPENDENT_AMBULATORY_CARE_PROVIDER_SITE_OTHER): Payer: 59 | Admitting: Internal Medicine

## 2013-04-28 ENCOUNTER — Ambulatory Visit: Payer: 59 | Admitting: Internal Medicine

## 2013-04-28 VITALS — BP 122/72 | HR 88 | Temp 98.2°F | Resp 18 | Ht 67.5 in | Wt 152.0 lb

## 2013-04-28 DIAGNOSIS — F17200 Nicotine dependence, unspecified, uncomplicated: Secondary | ICD-10-CM | POA: Insufficient documentation

## 2013-04-28 DIAGNOSIS — F4320 Adjustment disorder, unspecified: Secondary | ICD-10-CM

## 2013-04-28 DIAGNOSIS — G4700 Insomnia, unspecified: Secondary | ICD-10-CM

## 2013-04-28 DIAGNOSIS — J309 Allergic rhinitis, unspecified: Secondary | ICD-10-CM | POA: Insufficient documentation

## 2013-04-28 DIAGNOSIS — J45909 Unspecified asthma, uncomplicated: Secondary | ICD-10-CM | POA: Insufficient documentation

## 2013-04-28 MED ORDER — MOMETASONE FUROATE 50 MCG/ACT NA SUSP: 2.0000 | Freq: Every day | NASAL | Status: DC

## 2013-04-28 MED ORDER — BECLOMETHASONE DIPROPIONATE 80 MCG/ACT IN AERS: 2.0000 | INHALATION_SPRAY | Freq: Two times a day (BID) | RESPIRATORY_TRACT | Status: DC

## 2013-04-28 MED ORDER — CLONAZEPAM 0.5 MG PO TABS: 0.5000 mg | ORAL_TABLET | Freq: Every evening | ORAL | Status: DC | PRN

## 2013-04-28 MED ORDER — ESCITALOPRAM OXALATE 20 MG PO TABS: 20.0000 mg | ORAL_TABLET | Freq: Every day | ORAL | Status: DC

## 2013-04-28 NOTE — Progress Notes (Addendum)
Subjective:    Patient ID: Sheri Allen, female    DOB: March 05, 1985, 28 y.o.   MRN: [id number] This chart was scribed for Tonye Pearsonobert P Leimomi Zervas, MD by Danella Maiersaroline Early, ED Scribe. This patient was seen in room 25 and the patient's care was started at 12:05 PM.  Chief Complaint  Patient presents with  . Follow-up    personal    HPI HPI Comments: Sheri Allen is a 28 y.o. female who [facility] up.   PCP - Tonye PearsonOLITTLE, Nayely Dingus P, MD  Patient Active Problem List   Diagnosis Date Noted  . Nicotine addiction---trying to reduce 04/28/2013  . RAD (reactive airway disease)--resp to pred  At last OV but relapsed x 2--needing albu for exercise/lots of PND, head congestion//no fever 04/28/2013  . AR (allergic rhinitis)--sneezing despite atrovent singulair and allegra  04/28/2013  . Insomnia--only needing prn Klon now 01/16/2012  . Adjustment reaction---is much better at 10 lex and would like to continue /increase- Still reluc re there/pref to talk to friend instead stress often an issue 01/16/2012    Current Outpatient Prescriptions on File Prior to Visit  Medication Sig Dispense Refill  . albuterol (PROVENTIL HFA;VENTOLIN HFA) 108 (90 BASE) MCG/ACT inhaler Inhale 2 puffs into the lungs every 4 (four) hours as needed for wheezing or shortness of breath.      . clonazePAM (KLONOPIN) 0.5 MG tablet Take 1 tablet (0.5 mg total) by mouth at bedtime as needed.  30 tablet  0  . escitalopram (LEXAPRO) 10 MG tablet Take 1 tablet (10 mg total) by mouth daily.  30 tablet  2  . fexofenadine (ALLEGRA) 180 MG tablet Take 1 tablet (180 mg total) by mouth daily.  90 tablet  3  . ipratropium (ATROVENT) 0.03 % nasal spray Place 2 sprays into the nose 3 (three) times daily.  30 mL  11  . montelukast (SINGULAIR) 10 MG tablet Take 10 mg by mouth at bedtime.      . norgestimate-ethinyl estradiol (ORTHO-CYCLEN,SPRINTEC,PREVIFEM) 0.25-35 MG-MCG tablet Take 1 tablet by  mouth daily.       No current facility-administered medications on file prior to visit.      Review of Systems  Constitutional: Negative for fever, activity change and unexpected weight change.  HENT: Negative for sore throat and trouble swallowing.   Cardiovascular: Negative for chest pain and palpitations.  Gastrointestinal:       No reflux or dyspepsia       Objective:   Physical Exam  Nursing note and vitals reviewed. Constitutional: She is oriented to person, place, and time. She appears well-developed and well-nourished. No distress.  HENT:  Head: Normocephalic and atraumatic.  Nasal congestion  Eyes: Conjunctivae and EOM are normal. Pupils are equal, round, and reactive to light.  Neck: Neck supple. No thyromegaly present.  Cardiovascular: Normal rate, regular rhythm and normal heart sounds.   Pulmonary/Chest: Effort normal.  Rhonchi thruout with wheezing bilat w/ FExpir  Musculoskeletal: Normal range of motion.  Lymphadenopathy:    She has no cervical adenopathy.  Neurological: She is alert and oriented to person, place, and time.  Skin: Skin is warm and dry.  Psychiatric: She has a normal mood and affect. Her behavior is normal.    Filed Vitals:   04/28/13 1142  BP: 122/72  Pulse: 88  Temp: 98.2 F (36.8 C)  Resp: 18  Height: 5' 7.5" (1.715 m)  Weight: 152 lb (68.947 kg)  SpO2: 98%   Wt Readings from Last 3 Encounters:  04/28/13 152 lb (68.947 kg)  03/10/13 162 lb (73.483 kg)  02/18/13 153 lb (69.4 kg)         Assessment & Plan:    I have completed the patient encounter in its entirety as documented by the scribe, with editing by me where necessary. Surafel Hilleary P. Merla Riches, M.D.  Nicotine addiction-reduce  RAD (reactive airway disease)--start qvar 3months  AR (allergic rhinitis)--add nasonex  Adjustment reaction---incr to lex 20  Insomnia  Meds ordered this encounter  Medications  . mometasone (NASONEX) 50 MCG/ACT nasal spray    Sig:  Place 2 sprays into the nose daily. Each nostril    Dispense:  17 g    Refill:  12  . beclomethasone (QVAR) 80 MCG/ACT inhaler    Sig: Inhale 2 puffs into the lungs 2 (two) times daily.    Dispense:  1 Inhaler    Refill:  12  . escitalopram (LEXAPRO) 20 MG tablet    Sig: Take 1 tablet (20 mg total) by mouth daily.    Dispense:  30 tablet    Refill:  2  . clonazePAM (KLONOPIN) 0.5 MG tablet    Sig: Take 1 tablet (0.5 mg total) by mouth at bedtime as needed.    Dispense:  30 tablet    Refill:  2   3mos

## 2013-06-01 ENCOUNTER — Telehealth: Payer: Self-pay | Admitting: Family Medicine

## 2013-06-01 MED ORDER — FEXOFENADINE HCL 180 MG PO TABS: 180.0000 mg | ORAL_TABLET | Freq: Every day | ORAL | Status: DC

## 2013-06-01 NOTE — Telephone Encounter (Signed)
refill for allegra #90  Refill 3

## 2013-06-01 NOTE — Telephone Encounter (Signed)
Done

## 2013-08-03 ENCOUNTER — Telehealth: Payer: Self-pay | Admitting: Family Medicine

## 2013-08-03 MED ORDER — ESCITALOPRAM OXALATE 20 MG PO TABS: 20.0000 mg | ORAL_TABLET | Freq: Every day | ORAL | Status: DC

## 2013-08-03 NOTE — Telephone Encounter (Signed)
Pt would like a refill on lexapro 20 mg please.

## 2013-08-18 ENCOUNTER — Encounter: Payer: Self-pay | Admitting: Physician Assistant

## 2013-08-18 NOTE — Progress Notes (Signed)
My Chart test : Letter

## 2013-11-09 ENCOUNTER — Telehealth: Payer: Self-pay | Admitting: *Deleted

## 2013-11-09 MED ORDER — MONTELUKAST SODIUM 10 MG PO TABS: 10.0000 mg | ORAL_TABLET | Freq: Every day | ORAL | Status: DC

## 2013-11-09 NOTE — Telephone Encounter (Signed)
Sent!

## 2013-11-09 NOTE — Telephone Encounter (Signed)
Pt needs refill on Singulair

## 2013-11-17 IMAGING — CR DG ABDOMEN 1V
1 series · 1 of 1 positions shown · non-contrast
Comparison: 06/05/2012

CLINICAL DATA: Left upper quadrant abdominal pain.

ABDOMEN - 1 VIEW

[AP]
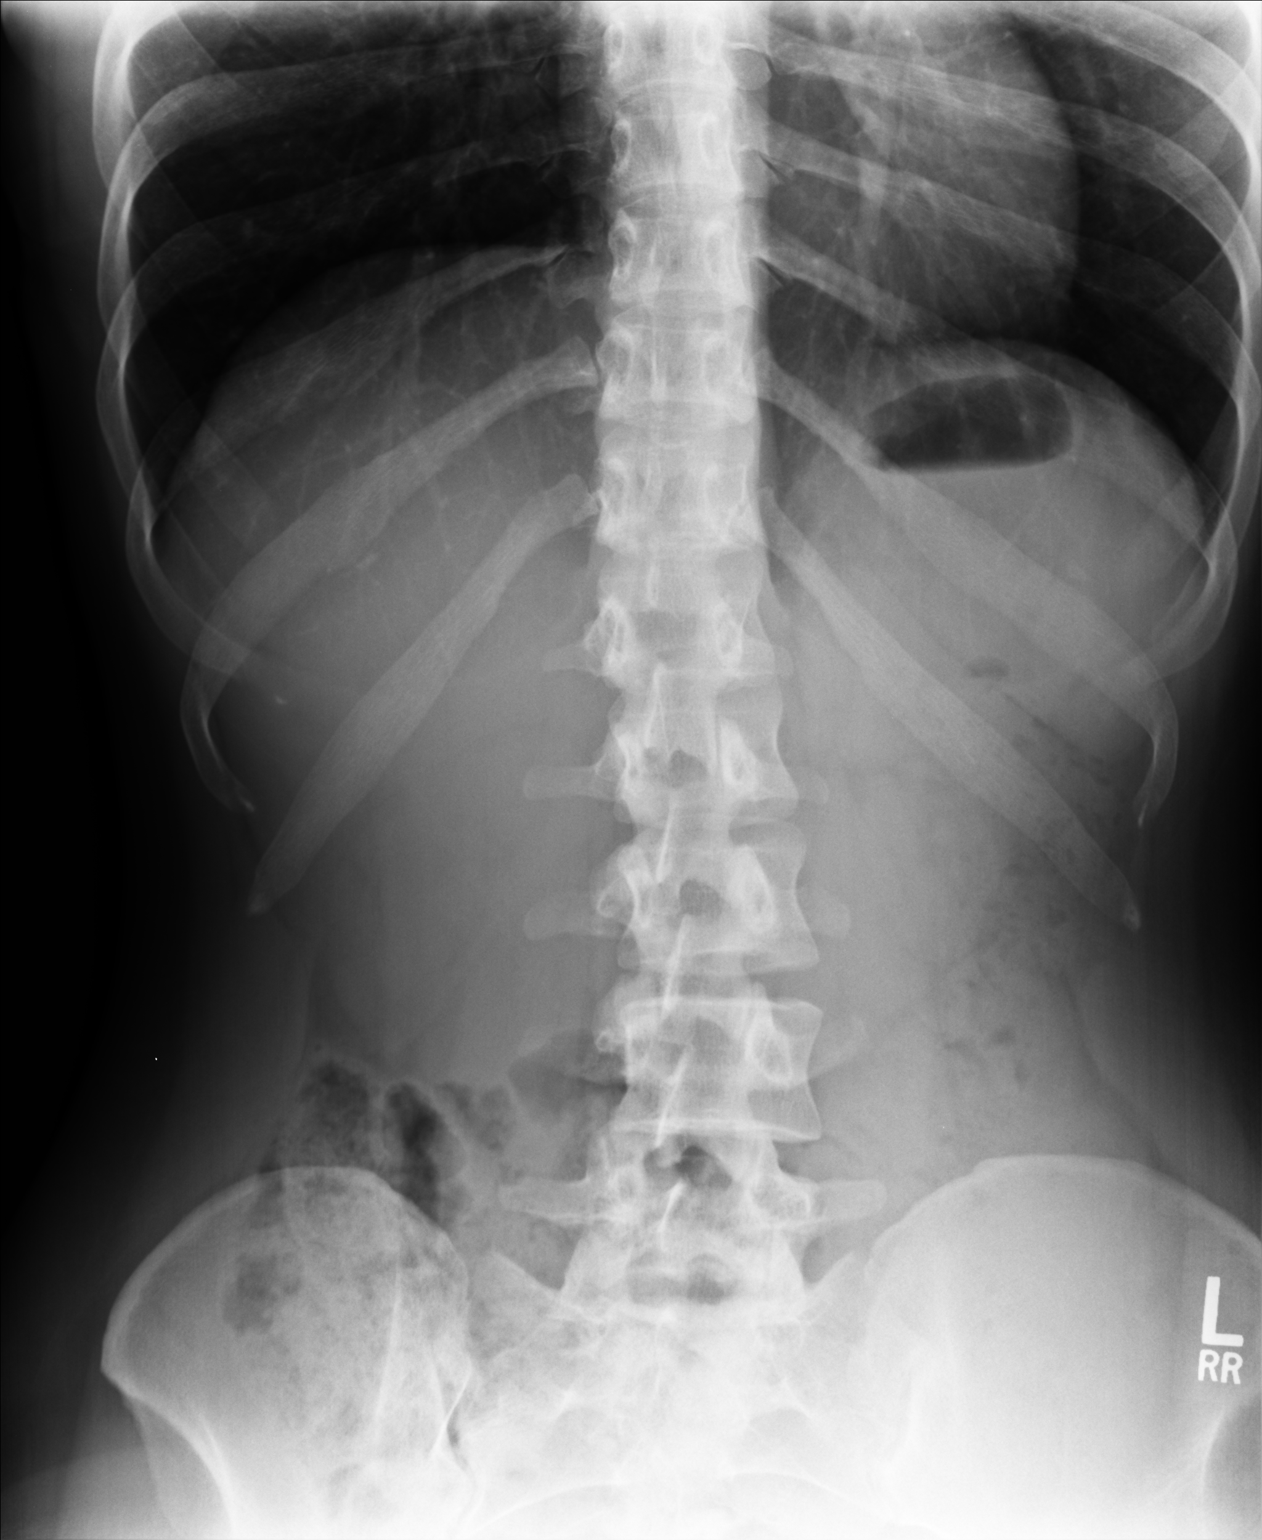

[1 of 1 positions shown; findings below may reference images not displayed]

FINDINGS: Single view of the abdomen was obtained.  There is stool
and gas in the colon.  Nonobstructive bowel gas pattern.  Air-fluid
level in the stomach.  Lung bases are clear.  Again noted is a
large amount of stool in the right lower abdomen.
IMPRESSION: Nonspecific bowel gas pattern.

## 2014-03-01 ENCOUNTER — Telehealth: Payer: Self-pay | Admitting: Family Medicine

## 2014-03-01 MED ORDER — MONTELUKAST SODIUM 10 MG PO TABS: 10.0000 mg | ORAL_TABLET | Freq: Every day | ORAL | Status: AC

## 2014-03-01 NOTE — Telephone Encounter (Signed)
Pt requests 90 day supply of singulair, appointment scheduled at the end of January,   Refilling med

## 2014-03-30 ENCOUNTER — Encounter: Payer: Self-pay | Admitting: Internal Medicine

## 2014-03-30 ENCOUNTER — Ambulatory Visit (INDEPENDENT_AMBULATORY_CARE_PROVIDER_SITE_OTHER): Payer: 59 | Admitting: Internal Medicine

## 2014-03-30 VITALS — BP 112/70 | HR 69 | Temp 98.1°F | Resp 16 | Ht 67.0 in | Wt 176.0 lb

## 2014-03-30 DIAGNOSIS — F4323 Adjustment disorder with mixed anxiety and depressed mood: Secondary | ICD-10-CM

## 2014-03-30 MED ORDER — ESCITALOPRAM OXALATE 20 MG PO TABS: 20.0000 mg | ORAL_TABLET | Freq: Every day | ORAL | Status: AC

## 2014-03-30 NOTE — Progress Notes (Signed)
F/u Adjustment disorder with mixed anxiety and depressed mood  following visits/follow-ups January 2015 she has continued on Lexapro 20 mg She went for 1 counseling appointment with Tommi EmerySarah Gates but chose not to return Over recent weeks she has been complaining of increased depression, and hedonism, withdrawal, excessive sleepiness. Things are not going well in her relationship as they no longer have time for each other.she is not able to make him understand her need for other things to add to the current lifestyle.  But with further questioning she recognizes significant improvement has occurred over the last year. She has a new job responsibilities which is very exciting. Her partner has agreed to therapy for depression and is improving. He has a new job which may give the more time together. They have moved into a new house and are very settled currently her best friend is also living with them due to her a series of family problems for her. She continues to be more of a complainer and often creates some alienation and her spouse.  Insomnia is no longer a problem. I anxiety is improved to the point that she rarely needs Klonopin. We did not discuss smoking cessation at this visit   Plan Continue Lexapro 20 mg Continue sitter restarting counseling Consider couples counseling Begin to approach him with positive statements only Go ahead and plan for traveling adventures Discussed multiple other ways to intervene for her to choose from Meds given for 6 months but she can follow-up at any point for further counseling 40minOV

## 2014-04-13 ENCOUNTER — Telehealth: Payer: Self-pay | Admitting: Family Medicine

## 2014-04-13 MED ORDER — CLONAZEPAM 0.5 MG PO TABS: 0.5000 mg | ORAL_TABLET | Freq: Every evening | ORAL | Status: AC | PRN

## 2014-04-13 NOTE — Telephone Encounter (Signed)
Patient request refill on clonazepam. [facility]. Please advise

## 2014-04-14 NOTE — Telephone Encounter (Signed)
Faxed and notified pt on VM. 

## 2014-05-18 ENCOUNTER — Ambulatory Visit (INDEPENDENT_AMBULATORY_CARE_PROVIDER_SITE_OTHER): Payer: 59 | Admitting: Family Medicine

## 2014-05-18 VITALS — BP 110/72 | HR 71 | Temp 98.3°F | Resp 16

## 2014-05-18 DIAGNOSIS — R8290 Unspecified abnormal findings in urine: Secondary | ICD-10-CM

## 2014-05-18 DIAGNOSIS — R300 Dysuria: Secondary | ICD-10-CM | POA: Diagnosis not present

## 2014-05-18 LAB — POCT UA - MICROSCOPIC ONLY
CRYSTALS, UR, HPF, POC: NEGATIVE
Casts, Ur, LPF, POC: NEGATIVE
Mucus, UA: NEGATIVE
RBC, URINE, MICROSCOPIC: NEGATIVE
Yeast, UA: NEGATIVE

## 2014-05-18 LAB — POCT URINALYSIS DIPSTICK
Bilirubin, UA: NEGATIVE
Blood, UA: NEGATIVE
Glucose, UA: NEGATIVE
Ketones, UA: NEGATIVE
Nitrite, UA: POSITIVE
PROTEIN UA: 30
Spec Grav, UA: 1.02
Urobilinogen, UA: 1
pH, UA: 8

## 2014-05-18 MED ORDER — NITROFURANTOIN MONOHYD MACRO 100 MG PO CAPS: 100.0000 mg | ORAL_CAPSULE | Freq: Two times a day (BID) | ORAL | Status: DC

## 2014-05-18 NOTE — Progress Notes (Signed)
[facility] [address], [place] [address] [phone]  Date:  05/18/2014   Name:  Sheri Litterrin M Hossain   DOB:  [birth year]   MRN:  [id number]  PCP:  Tonye PearsonOLITTLE, ROBERT P, MD    Chief Complaint: Dysuria   History of Present Illness:  Sheri Allen is a 29 y.o. very pleasant female patient who presents with the following:  She has had one UTI in the past. She feels like she has a UTI now- she noted a "pinch" after urination yesterday and thought she should have it checked out.  No hematuria, but her urine smells strong No fever, vomiting, no belly pain or back pain.  She notes tenderness over her bladder.  LMP was about 3 weeks ago, she is due for menses next week  She is on OCP   Patient Active Problem List   Diagnosis Date Noted  . Nicotine addiction 04/28/2013  . RAD (reactive airway disease) 04/28/2013  . AR (allergic rhinitis) 04/28/2013  . Insomnia 01/16/2012  . Adjustment reaction 01/16/2012    Past Medical History  Diagnosis Date  . Allergy   . Bronchospasm   . Depression     Past Surgical History  Procedure Laterality Date  . Wisdom tooth extraction      History  Substance Use Topics  . Smoking status: Current Every Day Smoker -- 0.50 packs/day  . Smokeless tobacco: Never Used  . Alcohol Use: Yes     Comment: ocasionally    Family History  Problem Relation Age of Onset  . Hyperlipidemia Paternal Grandmother   . Hypertension Paternal Grandmother     Allergies  Allergen Reactions  . Amoxicillin Hives  . Hydrocodone Nausea And Vomiting  . Miconazole Nitrate Hives    Medication list has been reviewed and updated.  Current Outpatient Prescriptions on File Prior to Visit  Medication Sig Dispense Refill  . albuterol (PROVENTIL HFA;VENTOLIN HFA) 108 (90 BASE) MCG/ACT inhaler Inhale 2 puffs into the lungs every 4 (four) hours as needed for wheezing or shortness of breath.    . beclomethasone (QVAR) 80 MCG/ACT inhaler Inhale 2  puffs into the lungs 2 (two) times daily. 1 Inhaler 12  . clonazePAM (KLONOPIN) 0.5 MG tablet Take 1 tablet (0.5 mg total) by mouth at bedtime as needed. 30 tablet 2  . escitalopram (LEXAPRO) 20 MG tablet Take 1 tablet (20 mg total) by mouth daily. 90 tablet 1  . fexofenadine (ALLEGRA) 180 MG tablet Take 1 tablet (180 mg total) by mouth daily. 90 tablet 3  . ipratropium (ATROVENT) 0.03 % nasal spray Place 2 sprays into the nose 3 (three) times daily. 30 mL 11  . mometasone (NASONEX) 50 MCG/ACT nasal spray Place 2 sprays into the nose daily. Each nostril 17 g 12  . montelukast (SINGULAIR) 10 MG tablet Take 1 tablet (10 mg total) by mouth at bedtime. 90 tablet 3  . norgestimate-ethinyl estradiol (ORTHO-CYCLEN,SPRINTEC,PREVIFEM) 0.25-35 MG-MCG tablet Take 1 tablet by mouth daily.     No current facility-administered medications on file prior to visit.    Review of Systems:  As per HPI- otherwise negative.   Physical Examination: Filed Vitals:   05/18/14 1654  BP: 110/72  Pulse: 71  Temp: 98.3 F (36.8 C)  Resp: 16   There were no vitals filed for this visit. There is no weight on file to calculate BMI. Ideal Body Weight:    GEN: WDWN, NAD, Non-toxic, A & O x 3, looks well  HEENT: Atraumatic, Normocephalic. Neck supple. No masses, No LAD. Ears and Nose: No external deformity. CV: RRR, No M/G/R. No JVD. No thrill. No extra heart sounds. PULM: CTA B, no wheezes, crackles, rhonchi. No retractions. No resp. distress. No accessory muscle use. ABD: S,  ND, +BS. No rebound. No HSM.  Mild TTP over bladder, no CVA tenderness  EXTR: No c/c/e NEURO Normal gait.  PSYCH: Normally interactive. Conversant. Not depressed or anxious appearing.  Calm demeanor.     Assessment and Plan: Dysuria - Plan: POCT UA - Microscopic Only, POCT urinalysis dipstick, nitrofurantoin, macrocrystal-monohydrate, (MACROBID) 100 MG capsule, Urine culture  Abnormal urine odor - Plan: POCT UA - Microscopic Only,  POCT urinalysis dipstick, nitrofurantoin, macrocrystal-monohydrate, (MACROBID) 100 MG capsule  Treat for UTI with macrobid for 7 days, await urine culture Reminded that abx could interfere with her OCP   Signed Abbe Amsterdam, MD

## 2014-05-18 NOTE — Patient Instructions (Signed)
I will be in touch with your urine culture Let me know if you are not feeling better in 1-2 days- Sooner if worse.   Use macrobid twice a day for 7 days for UTI

## 2014-05-20 LAB — URINE CULTURE: Colony Count: >=100000

## 2014-06-06 ENCOUNTER — Other Ambulatory Visit: Payer: Self-pay | Admitting: Radiology

## 2014-06-06 DIAGNOSIS — Z9109 Other allergy status, other than to drugs and biological substances: Secondary | ICD-10-CM

## 2014-06-06 MED ORDER — FEXOFENADINE HCL 180 MG PO TABS: 180.0000 mg | ORAL_TABLET | Freq: Every day | ORAL | Status: AC

## 2014-06-09 ENCOUNTER — Other Ambulatory Visit: Payer: Self-pay | Admitting: Physician Assistant

## 2014-07-28 ENCOUNTER — Ambulatory Visit (INDEPENDENT_AMBULATORY_CARE_PROVIDER_SITE_OTHER): Payer: 59 | Admitting: Physician Assistant

## 2014-07-28 VITALS — BP 120/68 | HR 91 | Temp 100.0°F | Resp 20 | Ht 67.52 in | Wt 182.0 lb

## 2014-07-28 DIAGNOSIS — J209 Acute bronchitis, unspecified: Secondary | ICD-10-CM | POA: Diagnosis not present

## 2014-07-28 DIAGNOSIS — J309 Allergic rhinitis, unspecified: Secondary | ICD-10-CM | POA: Diagnosis not present

## 2014-07-28 DIAGNOSIS — R062 Wheezing: Secondary | ICD-10-CM

## 2014-07-28 DIAGNOSIS — J4521 Mild intermittent asthma with (acute) exacerbation: Secondary | ICD-10-CM | POA: Diagnosis not present

## 2014-07-28 MED ORDER — AZITHROMYCIN 250 MG PO TABS: ORAL_TABLET | ORAL | Status: DC

## 2014-07-28 MED ORDER — ALBUTEROL SULFATE (2.5 MG/3ML) 0.083% IN NEBU: 2.5000 mg | INHALATION_SOLUTION | Freq: Once | RESPIRATORY_TRACT | Status: DC

## 2014-07-28 MED ORDER — MOMETASONE FUROATE 50 MCG/ACT NA SUSP: 2.0000 | Freq: Every day | NASAL | Status: DC

## 2014-07-28 MED ORDER — IPRATROPIUM BROMIDE 0.02 % IN SOLN: 0.5000 mg | Freq: Once | RESPIRATORY_TRACT | Status: DC

## 2014-07-28 MED ORDER — ALBUTEROL SULFATE HFA 108 (90 BASE) MCG/ACT IN AERS: 2.0000 | INHALATION_SPRAY | RESPIRATORY_TRACT | Status: DC | PRN

## 2014-07-28 NOTE — Progress Notes (Signed)
[facility] [address], [place] [address] [phone]  Date:  07/28/2014   Name:  Sheri Allen   DOB:  April 12, 1985   MRN:  [id number]  PCP:  Tonye PearsonOLITTLE, ROBERT P, MD    Chief Complaint: Cough and Nasal Congestion   History of Present Illness:  This is a 29 y.o. female with PMH allergic rhinitis and RAD who is presenting with nasal congestion and cough x 1 week. She has been wheezing and having "coughing fits". She is also having nasal congestion. Had some sore throat initially but this has resolved. She feels hot but does not feel bad. She only has problems with her asthma when she is sick. She is supposed to use qvar BID at the start of wheezing but states she hasn't used it in the past 1 week. She has been using her albuterol 1-2 times a day and states it helps a lot. She uses claritin, nasonex and singulair daily for allergic rhinitis. She is needing refills of albuterol and nasonex today.  Review of Systems:  Review of Systems  Constitutional: Positive for fever. Negative for chills.  HENT: Positive for congestion. Negative for ear pain, sinus pressure and sore throat.   Eyes: Negative for redness.  Respiratory: Positive for cough and wheezing. Negative for shortness of breath.   Gastrointestinal: Negative for nausea and vomiting.  Skin: Negative for rash.  Allergic/Immunologic: Positive for environmental allergies.  Hematological: Negative for adenopathy.  Psychiatric/Behavioral: Negative for sleep disturbance.    Patient Active Problem List   Diagnosis Date Noted  . Nicotine addiction 04/28/2013  . RAD (reactive airway disease) 04/28/2013  . AR (allergic rhinitis) 04/28/2013  . Insomnia 01/16/2012  . Adjustment reaction 01/16/2012    Prior to Admission medications   Medication Sig Start Date End Date Taking? Authorizing Provider  albuterol (PROVENTIL HFA;VENTOLIN HFA) 108 (90 BASE) MCG/ACT inhaler Inhale 2 puffs into the lungs every 4 (four)  hours as needed for wheezing or shortness of breath.   Yes Historical Provider, MD  beclomethasone (QVAR) 80 MCG/ACT inhaler Inhale 2 puffs into the lungs 2 (two) times daily. 04/28/13  Yes Tonye Pearsonobert P Doolittle, MD  clonazePAM (KLONOPIN) 0.5 MG tablet Take 1 tablet (0.5 mg total) by mouth at bedtime as needed. 04/13/14  Yes Tonye Pearsonobert P Doolittle, MD  escitalopram (LEXAPRO) 20 MG tablet Take 1 tablet (20 mg total) by mouth daily. 03/30/14  Yes Tonye Pearsonobert P Doolittle, MD  fexofenadine (ALLEGRA) 180 MG tablet Take 1 tablet (180 mg total) by mouth daily. 06/06/14  Yes Morrell RiddleSarah L Weber, PA-C  ipratropium (ATROVENT) 0.03 % nasal spray Place 2 sprays into the nose 3 (three) times daily. 02/18/13  Yes Jessica C Copland, MD  mometasone (NASONEX) 50 MCG/ACT nasal spray Place 2 sprays into the nose daily. Each nostril 04/28/13  Yes Tonye Pearsonobert P Doolittle, MD  montelukast (SINGULAIR) 10 MG tablet Take 1 tablet (10 mg total) by mouth at bedtime. 03/01/14  Yes Tonye Pearsonobert P Doolittle, MD  norgestimate-ethinyl estradiol (ORTHO-CYCLEN,SPRINTEC,PREVIFEM) 0.25-35 MG-MCG tablet Take 1 tablet by mouth daily.   Yes Historical Provider, MD    Allergies  Allergen Reactions  . Amoxicillin Hives  . Hydrocodone Nausea And Vomiting  . Miconazole Nitrate Hives    Past Surgical History  Procedure Laterality Date  . Wisdom tooth extraction      History  Substance Use Topics  . Smoking status: Current Every Day Smoker -- 0.50 packs/day  . Smokeless tobacco: Never Used  . Alcohol Use:  0.0 oz/week    0 Standard drinks or equivalent per week     Comment: ocasionally    Family History  Problem Relation Age of Onset  . Hyperlipidemia Paternal Grandmother   . Hypertension Paternal Grandmother     Medication list has been reviewed and updated.  Physical Examination:  Physical Exam  Constitutional: She is oriented to person, place, and time. She appears well-developed and well-nourished. No distress.  HENT:  Head: Normocephalic and  atraumatic.  Right Ear: Hearing, external ear and ear canal normal. Tympanic membrane is retracted.  Left Ear: Hearing, external ear and ear canal normal. Tympanic membrane is retracted.  Nose: Nose normal.  Mouth/Throat: Uvula is midline, oropharynx is clear and moist and mucous membranes are normal.  Eyes: Conjunctivae and lids are normal. Right eye exhibits no discharge. Left eye exhibits no discharge. No scleral icterus.  Cardiovascular: Normal rate, regular rhythm, normal heart sounds and normal pulses.   No murmur heard. Pulmonary/Chest: Effort normal. No respiratory distress. She has wheezes (diffuse). She has rhonchi (diffuse). She has no rales.  Symptoms and lung sounds improved after neb treatment. Still with some wheezing and rhonchi in left lower lung fields.  Musculoskeletal: Normal range of motion.  Lymphadenopathy:       Head (right side): No submental, no submandibular and no tonsillar adenopathy present.       Head (left side): No submental, no submandibular and no tonsillar adenopathy present.    She has no cervical adenopathy.  Neurological: She is alert and oriented to person, place, and time.  Skin: Skin is warm, dry and intact. No lesion and no rash noted.  Psychiatric: She has a normal mood and affect. Her speech is normal and behavior is normal. Thought content normal.   BP 120/68 mmHg  Pulse 91  Temp(Src) 100 F (37.8 C) (Oral)  Resp 20  Ht 5' 7.52" (1.715 m)  Wt 182 lb (82.555 kg)  BMI 28.07 kg/m2  SpO2 98%  LMP 07/11/2014  Assessment and Plan:  1. Wheezing 2. Acute bronchitis, unspecified 3. Asthma with acute exacerbation, mild intermittent 4. Allergic rhinitis Neb treatment produced improvement in symptoms and lung sounds. D/t fever and being on day 7 of symptoms will treat with zpak. She was start using qvar BID until symptoms resolve. Cont albuterol prn. Nasonex and albuterol refilled. She will let me know if symptoms not improving in 5-7 days.  -  albuterol (PROVENTIL) (2.5 MG/3ML) 0.083% nebulizer solution 2.5 mg; Take 3 mLs (2.5 mg total) by nebulization once. - ipratropium (ATROVENT) nebulizer solution 0.5 mg; Take 2.5 mLs (0.5 mg total) by nebulization once. - azithromycin (ZITHROMAX) 250 MG tablet; Take 2 tabs PO x 1 dose, then 1 tab PO QD x 4 days  Dispense: 6 tablet; Refill: 0 - mometasone (NASONEX) 50 MCG/ACT nasal spray; Place 2 sprays into the nose daily. Each nostril  Dispense: 17 g; Refill: 12 - albuterol (PROVENTIL HFA;VENTOLIN HFA) 108 (90 BASE) MCG/ACT inhaler; Inhale 2 puffs into the lungs every 4 (four) hours as needed for wheezing or shortness of breath.  Dispense: 1 Inhaler; Refill: 12    Thelda Gagan V. Dyke Brackett, MHS [facility]  07/28/2014

## 2014-08-24 ENCOUNTER — Ambulatory Visit (INDEPENDENT_AMBULATORY_CARE_PROVIDER_SITE_OTHER): Payer: 59 | Admitting: Physician Assistant

## 2014-08-24 VITALS — BP 94/60 | HR 75 | Temp 98.7°F | Resp 16

## 2014-08-24 DIAGNOSIS — N898 Other specified noninflammatory disorders of vagina: Secondary | ICD-10-CM

## 2014-08-24 NOTE — Progress Notes (Signed)
Sheri Allen  MRN: [id number] DOB: October 01, 1985  Subjective:  Pt presents to clinic with an area on her right vaginal opening that has been painful over the last 5 days.  She has done nothing to it but it worried because she can not see it and it is not getting better.  She has never been exposed to herpes that she knows of.  She is married and not worried about STDs.  She mainly has pain when she urinates or wipes the area.  She has had no trauma to the area.  Patient Active Problem List   Diagnosis Date Noted  . Nicotine addiction 04/28/2013  . RAD (reactive airway disease) 04/28/2013  . AR (allergic rhinitis) 04/28/2013  . Insomnia 01/16/2012  . Adjustment reaction 01/16/2012    Current Outpatient Prescriptions on File Prior to Visit  Medication Sig Dispense Refill  . albuterol (PROVENTIL HFA;VENTOLIN HFA) 108 (90 BASE) MCG/ACT inhaler Inhale 2 puffs into the lungs every 4 (four) hours as needed for wheezing or shortness of breath. 1 Inhaler 12  . azithromycin (ZITHROMAX) 250 MG tablet Take 2 tabs PO x 1 dose, then 1 tab PO QD x 4 days 6 tablet 0  . beclomethasone (QVAR) 80 MCG/ACT inhaler Inhale 2 puffs into the lungs 2 (two) times daily. 1 Inhaler 12  . clonazePAM (KLONOPIN) 0.5 MG tablet Take 1 tablet (0.5 mg total) by mouth at bedtime as needed. 30 tablet 2  . escitalopram (LEXAPRO) 20 MG tablet Take 1 tablet (20 mg total) by mouth daily. 90 tablet 1  . fexofenadine (ALLEGRA) 180 MG tablet Take 1 tablet (180 mg total) by mouth daily. 90 tablet 3  . ipratropium (ATROVENT) 0.03 % nasal spray Place 2 sprays into the nose 3 (three) times daily. 30 mL 11  . mometasone (NASONEX) 50 MCG/ACT nasal spray Place 2 sprays into the nose daily. Each nostril 17 g 12  . montelukast (SINGULAIR) 10 MG tablet Take 1 tablet (10 mg total) by mouth at bedtime. 90 tablet 3  . norgestimate-ethinyl estradiol (ORTHO-CYCLEN,SPRINTEC,PREVIFEM) 0.25-35 MG-MCG tablet Take 1 tablet by mouth daily.      Current Facility-Administered Medications on File Prior to Visit  Medication Dose Route Frequency Provider Last Rate Last Dose  . albuterol (PROVENTIL) (2.5 MG/3ML) 0.083% nebulizer solution 2.5 mg  2.5 mg Nebulization Once Nicole Bush V, PA-C      . ipratropium (ATROVENT) nebulizer solution 0.5 mg  0.5 mg Nebulization Once Lanier Clam V, PA-C        Allergies  Allergen Reactions  . Amoxicillin Hives  . Hydrocodone Nausea And Vomiting  . Miconazole Nitrate Hives    Review of Systems  Genitourinary: Positive for vaginal pain and menstrual problem (irregular but neg Upreg and uses pills daily - mainly spotting during her placebo week). Negative for dysuria and urgency.   Objective:  BP 94/60 mmHg  Pulse 75  Temp(Src) 98.7 F (37.1 C) (Oral)  Resp 16  SpO2 98%  LMP 07/11/2014  Physical Exam  Constitutional: She is oriented to person, place, and time and well-developed, well-nourished, and in no distress.  HENT:  Head: Normocephalic and atraumatic.  Right Ear: Hearing and external ear normal.  Left Ear: Hearing and external ear normal.  Eyes: Conjunctivae are normal.  Neck: Normal range of motion.  Pulmonary/Chest: Effort normal.  Genitourinary: Vagina exhibits lesion (6 1-2 mm circular raw area without surrounging erythema and no vesicles present on her posteroir aspect of her inner labia majora).  Neurological:  She is alert and oriented to person, place, and time. Gait normal.  Skin: Skin is warm and dry.  Psychiatric: Mood, memory, affect and judgment normal.  Vitals reviewed.   Assessment and Plan :  Vaginal lesion - Plan: Herpes simplex virus culture   Pt to use destin for barrier protection to not allow the urine to come into contact with those area.  We will wait for her culture results because she does not want treatment today.  Benny Lennert PA-C  [facility] 08/24/2014 8:35 PM

## 2014-08-26 LAB — HERPES SIMPLEX VIRUS CULTURE: Organism ID, Bacteria: DETECTED

## 2014-08-29 ENCOUNTER — Telehealth: Payer: Self-pay | Admitting: Physician Assistant

## 2014-08-29 MED ORDER — VALACYCLOVIR HCL 1 G PO TABS: ORAL_TABLET | ORAL | Status: DC

## 2014-08-29 NOTE — Telephone Encounter (Signed)
D/w pt her results.  Her husband has fever blisters.  She is doing much better now and the pain has resolved.  We will send in a Rx for valtrex for the future and the patient was explained how to use the medication.

## 2014-10-31 ENCOUNTER — Telehealth: Payer: Self-pay | Admitting: Physician Assistant

## 2014-10-31 MED ORDER — VALACYCLOVIR HCL 1 G PO TABS: ORAL_TABLET | ORAL | Status: AC

## 2014-10-31 NOTE — Telephone Encounter (Signed)
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  Meds ordered this encounter  Medications  . valACYclovir (VALTREX) 1000 MG tablet    Sig: 1/2 po QD for suppression, 1/2 PO bid for 3 days for each outbreak    Dispense:  50 tablet    Refill:  3    Order Specific Question:  Supervising Provider    Answer:  DOOLITTLE, ROBERT P [[id number]]

## 2014-11-14 ENCOUNTER — Ambulatory Visit (INDEPENDENT_AMBULATORY_CARE_PROVIDER_SITE_OTHER): Payer: 59

## 2014-11-14 ENCOUNTER — Other Ambulatory Visit: Payer: Self-pay | Admitting: Physician Assistant

## 2014-11-14 DIAGNOSIS — Z202 Contact with and (suspected) exposure to infections with a predominantly sexual mode of transmission: Secondary | ICD-10-CM

## 2014-11-14 MED ORDER — AZITHROMYCIN 500 MG PO TABS: 1000.0000 mg | ORAL_TABLET | Freq: Once | ORAL | Status: DC

## 2014-11-14 MED ORDER — CEFTRIAXONE SODIUM 1 G IJ SOLR
250.0000 mg | Freq: Once | INTRAMUSCULAR | Status: AC
  Administered 2014-11-14: 250 mg via INTRAMUSCULAR

## 2014-11-14 NOTE — Progress Notes (Signed)
Husband tested positive for gonorrhea.  We will treat her.

## 2015-01-30 ENCOUNTER — Encounter: Payer: Self-pay | Admitting: Internal Medicine

## 2015-02-14 ENCOUNTER — Ambulatory Visit (INDEPENDENT_AMBULATORY_CARE_PROVIDER_SITE_OTHER): Payer: 59 | Admitting: Physician Assistant

## 2015-02-14 ENCOUNTER — Encounter: Payer: Self-pay | Admitting: Physician Assistant

## 2015-02-14 VITALS — BP 118/68 | HR 79 | Temp 98.2°F | Resp 16 | Ht 67.52 in

## 2015-02-14 DIAGNOSIS — N898 Other specified noninflammatory disorders of vagina: Secondary | ICD-10-CM

## 2015-02-14 LAB — POCT WET + KOH PREP: Trich by wet prep: ABSENT

## 2015-02-14 MED ORDER — FLUCONAZOLE 150 MG PO TABS: 150.0000 mg | ORAL_TABLET | Freq: Once | ORAL | Status: DC

## 2015-02-14 NOTE — Progress Notes (Signed)
Sheri Allen  MRN: [id number] DOB: [birth year]  Subjective:  Pt presents to clinic with a possible yeast infection.  She is having vaginal itching.  [place]  Patient Active Problem List   Diagnosis Date Noted  . Nicotine addiction 04/28/2013  . RAD (reactive airway disease) 04/28/2013  . AR (allergic rhinitis) 04/28/2013  . Insomnia 01/16/2012  . Adjustment reaction 01/16/2012    Current Outpatient Prescriptions on File Prior to Visit  Medication Sig Dispense Refill  . albuterol (PROVENTIL HFA;VENTOLIN HFA) 108 (90 BASE) MCG/ACT inhaler Inhale 2 puffs into the lungs every 4 (four) hours as needed for wheezing or shortness of breath. 1 Inhaler 12  . beclomethasone (QVAR) 80 MCG/ACT inhaler Inhale 2 puffs into the lungs 2 (two) times daily. 1 Inhaler 12  . clonazePAM (KLONOPIN) 0.5 MG tablet Take 1 tablet (0.5 mg total) by mouth at bedtime as needed. 30 tablet 2  . escitalopram (LEXAPRO) 20 MG tablet Take 1 tablet (20 mg total) by mouth daily. 90 tablet 1  . fexofenadine (ALLEGRA) 180 MG tablet Take 1 tablet (180 mg total) by mouth daily. 90 tablet 3  . ipratropium (ATROVENT) 0.03 % nasal spray Place 2 sprays into the nose 3 (three) times daily. 30 mL 11  . mometasone (NASONEX) 50 MCG/ACT nasal spray Place 2 sprays into the nose daily. Each nostril 17 g 12  . montelukast (SINGULAIR) 10 MG tablet Take 1 tablet (10 mg total) by mouth at bedtime. 90 tablet 3  . norgestimate-ethinyl estradiol (ORTHO-CYCLEN,SPRINTEC,PREVIFEM) 0.25-35 MG-MCG tablet Take 1 tablet by mouth daily.    . valACYclovir (VALTREX) 1000 MG tablet 1/2 po QD for suppression, 1/2 PO bid for 3 days for each outbreak 50 tablet 3   No current facility-administered medications on file prior to visit.    Allergies  Allergen Reactions  . Amoxicillin Hives  . Hydrocodone Nausea And Vomiting  . Miconazole Nitrate Hives    Review of Systems  Genitourinary: Positive for vaginal discharge.    Objective:  BP 118/68 mmHg  Pulse 79  Temp(Src) 98.2 F (36.8 C) (Oral)  Resp 16  Ht 5' 7.52" (1.715 m)  SpO2 98%  Physical Exam  Constitutional: She is oriented to person, place, and time and well-developed, well-nourished, and in no distress.  HENT:  Head: Normocephalic and atraumatic.  Right Ear: Hearing and external ear normal.  Left Ear: Hearing and external ear normal.  Eyes: Conjunctivae are normal.  Neck: Normal range of motion.  Pulmonary/Chest: Effort normal.  Neurological: She is alert and oriented to person, place, and time. Gait normal.  Skin: Skin is warm and dry.  Psychiatric: Mood, memory, affect and judgment normal.  Vitals reviewed.  Results for orders placed or performed in visit on 02/14/15  POCT Wet + KOH Prep  Result Value Ref Range   Yeast by KOH Present Present, Absent   Yeast by wet prep Present Present, Absent   WBC by wet prep None None, Few, Too numerous to count   Clue Cells Wet Prep HPF POC Few (A) None, Too numerous to count   Trich by wet prep Absent Present, Absent   Bacteria Wet Prep HPF POC Few None, Few, Too numerous to count   Epithelial Cells By [employer]et Pref ([facility]) Many (A) None, Few, Too numerous to count   RBC,UR,HPF,POC None None RBC/hpf    Assessment and Plan :  Vaginal discharge - Plan: POCT Wet + KOH Prep, fluconazole (DIFLUCAN) 150 MG  tablet  Benny Lennert PA-C  [facility] 02/14/2015 4:21 PM

## 2015-02-16 ENCOUNTER — Other Ambulatory Visit: Payer: Self-pay

## 2015-02-16 MED ORDER — BECLOMETHASONE DIPROPIONATE 80 MCG/ACT IN AERS: 2.0000 | INHALATION_SPRAY | Freq: Two times a day (BID) | RESPIRATORY_TRACT | Status: DC

## 2015-02-16 NOTE — Telephone Encounter (Signed)
Pt request refill on Qvar inhaler. Please advise on refill.  Inhaler pended.

## 2015-02-17 ENCOUNTER — Ambulatory Visit (INDEPENDENT_AMBULATORY_CARE_PROVIDER_SITE_OTHER): Payer: 59 | Admitting: Physician Assistant

## 2015-02-17 VITALS — BP 108/68 | HR 87 | Temp 98.8°F | Resp 14

## 2015-02-17 DIAGNOSIS — J4520 Mild intermittent asthma, uncomplicated: Secondary | ICD-10-CM | POA: Diagnosis not present

## 2015-02-17 DIAGNOSIS — M25562 Pain in left knee: Secondary | ICD-10-CM

## 2015-02-17 DIAGNOSIS — M769 Unspecified enthesopathy, lower limb, excluding foot: Secondary | ICD-10-CM | POA: Diagnosis not present

## 2015-02-17 DIAGNOSIS — M76899 Other specified enthesopathies of unspecified lower limb, excluding foot: Secondary | ICD-10-CM

## 2015-02-17 DIAGNOSIS — R062 Wheezing: Secondary | ICD-10-CM

## 2015-02-17 MED ORDER — DICLOFENAC SODIUM 75 MG PO TBEC: 75.0000 mg | DELAYED_RELEASE_TABLET | Freq: Two times a day (BID) | ORAL | Status: AC

## 2015-02-17 MED ORDER — IPRATROPIUM BROMIDE 0.03 % NA SOLN: 2.0000 | Freq: Three times a day (TID) | NASAL | Status: AC

## 2015-02-17 MED ORDER — BECLOMETHASONE DIPROPIONATE 80 MCG/ACT IN AERS: 2.0000 | INHALATION_SPRAY | Freq: Two times a day (BID) | RESPIRATORY_TRACT | Status: AC

## 2015-02-17 MED ORDER — MOMETASONE FUROATE 50 MCG/ACT NA SUSP: 2.0000 | Freq: Every day | NASAL | Status: AC

## 2015-02-17 MED ORDER — ALBUTEROL SULFATE HFA 108 (90 BASE) MCG/ACT IN AERS: 2.0000 | INHALATION_SPRAY | RESPIRATORY_TRACT | Status: AC | PRN

## 2015-02-17 NOTE — Patient Instructions (Signed)
Quadriceps Tendon Tear or Disruption With Rehab The quadriceps muscles are located on the front of the thigh and are responsible for straightening the knee and bending the hip. The quadriceps tendon connects these muscles to the kneecap (patella) and also from the patella to a portion of the shin bone (tibial tubercle). A quadriceps tendon tear or disruption is characterized by a partial or complete tear of the quadriceps tendon between the quadriceps muscles and the patella. Quadriceps tendon tears or disruptions often cause pain above the knee and result in a decrease in function of the quadriceps muscles.  SYMPTOMS   A "pop" or tear felt or heard above the patella at the time of injury.  Pain, tenderness, inflammation, and/or bruising over the quadriceps tendon.  Pain that worsens with use of the quadriceps muscles.  Difficulty with common tasks that involve the quadriceps muscle, such as walking.  A crackling sound (crepitation) when the tendon is moved or touched.  Loss of fullness of the muscle or bulging within the area of muscle with complete rupture. CAUSES  A strain occurs when a force is placed on the muscle or tendon that is greater than it can withstand. Common mechanisms of injury include:  Repetitive strenuous use of the quadriceps muscles. This may be due to an increase in the intensity, frequency, or duration of exercise.  Direct trauma to the quadriceps muscles or tendons. RISK INCREASES WITH:  Activities that involve forceful contractions of the quadriceps muscles (jumping or sprinting).  Contact sports (soccer or football).  Poor strength and flexibility.  Failure to warm-up properly before activity.  Previous injury to the thigh or knee.  Untreated quadriceps tendinitis.  Corticosteroid injections into the quadriceps tendon. PREVENTION  Warm up and stretch properly before activity.  Allow for adequate recovery between workouts.  Maintain physical  fitness:  Strength, flexibility, and endurance.  Cardiovascular fitness.  Wear properly fitted and padded protective equipment. PROGNOSIS  If treated properly, then recovery from a quadriceps tendon tear or disruption usually occurs; however the recovery period may b 6 to 9 months.  RELATED COMPLICATIONS   Quadriceps muscle weakness.  Re-rupture of the tendon after treatment.  Prolonged healing time, if improperly treated or re-injured.  Risks of surgery: infection, bleeding, nerve damage, or damage to surrounding tissues. TREATMENT  Initial treatment involves rest from any activities that aggravate the symptoms. Ice, medication, and elevation may be used to help reduce pain and inflammation. The use of strengthening and stretching exercises may help reduce pain with activity. These exercises may be performed at home or with referral to a therapist. If the tear is complete, then surgery is usually required to repair the tendon, as it cannot heal on its own. After surgery immobilization is required to allow for healing. After immobilization it is important to perform strengthening and stretching exercises to help regain strength and a full range of motion.  MEDICATION   If pain medication is necessary, then nonsteroidal anti-inflammatory medications, such as aspirin and ibuprofen, or other minor pain relievers, such as acetaminophen, are often recommended.  Do not take pain medication for 7 days before surgery.  Prescription pain relievers may be given if deemed necessary by your caregiver. Use only as directed and only as much as you need. COLD THERAPY  Cold treatment (icing) relieves pain and reduces inflammation. Cold treatment should be applied for 10 to 15 minutes every 2 to 3 hours for inflammation and pain and immediately after any activity that aggravates your symptoms. Use   ice packs or massage the area with a piece of ice (ice massage). SEEK MEDICAL CARE IF:  Treatment seems to  offer no benefit, or the condition worsens.  Any medications produce adverse side effects.  Any complications from surgery occur:  Pain, numbness, or coldness in the extremity operated upon.  Discoloration of the nail beds (they become blue or gray) of the extremity operated upon.  Signs of infections (fever, pain, inflammation, redness, or persistent bleeding). EXERCISES RANGE OF MOTION (ROM) AND STRETCHING EXERCISES - Quadriceps Tendon Tear/Disruption These exercises may help you when beginning to rehabilitate your injury. Your symptoms may resolve with or without further involvement from your physician, physical therapist or athletic trainer. While completing these exercises, remember:   Restoring tissue flexibility helps normal motion to return to the joints. This allows healthier, less painful movement and activity.  An effective stretch should be held for at least 30 seconds.  A stretch should never be painful. You should only feel a gentle lengthening or release in the stretched tissue. RANGE OF MOTION - Knee Flexion and Extension, Active-Assisted  Sit on the edge of a table or chair with your thighs firmly supported. It may be helpful to place a folded towel under the end of your right / left thigh.  Flexion (bending): Place the ankle of your healthy leg on top of the other ankle. Use your healthy leg to gently bend your right / left knee until you feel a mild tension across the top of your knee.  Hold for __________ seconds.  Extension (straightening): Switch your ankles so your right / left leg is on top. Use your healthy leg to straighten your right / left knee until you feel a mild tension on the backside of your knee.  Hold for __________ seconds. Repeat __________ times. Complete __________ times per day. RANGE OF MOTION - Knee Flexion, Active  Lie on your back with both knees straight. (If this causes back discomfort, bend your opposite knee, placing your foot flat on  the floor.)  Slowly slide your heel back toward your buttocks until you feel a gentle stretch in the front of your knee or thigh.  Hold for __________ seconds. Slowly slide your heel back to the starting position. Repeat __________ times. Complete this exercise __________ times per day.  STRETCH - Knee Flexion, Supine  Lie on the floor with your right / left heel/foot lightly touching the wall (place both feet on the wall if you do not use a door frame).  Without using any effort, allow gravity to slide your foot down the wall slowly until you feel a gentle stretch in the front of your right / left knee.  Hold this stretch for __________ seconds. Then return the leg to the starting position, using your health leg for help, if needed. Repeat __________ times. Complete this stretch __________ times per day.  STRETCH - Quadriceps, Prone   Lie on your stomach on a firm surface, such as a bed or padded floor.  Bend your right / left knee and grasp your ankle. If you are unable to reach, your ankle or pant leg, use a belt around your foot to lengthen your reach.  Gently pull your heel toward your buttocks. Your knee should not slide out to the side. You should feel a stretch in the front of your thigh and/or knee. Hold this position for __________ seconds.  Repeat __________ times. Complete this stretch __________ times per day.  STRENGTHENING EXERCISES - Quadriceps Tendon   Tear/Disruption These exercises may help you when beginning to rehabilitate your injury. They may resolve your symptoms with or without further involvement from your physician, physical therapist or athletic trainer. While completing these exercises, remember:   Muscles can gain both the endurance and the strength needed for everyday activities through controlled exercises.  Complete these exercises as instructed by your physician, physical therapist or athletic trainer. Progress the resistance and repetitions only as  guided. STRENGTH - Quadriceps, Isometrics  Lie on your back with your right / left leg extended and your opposite knee bent.  Gradually tense the muscles in the front of your right / left thigh. You should see either your knee cap slide up toward your hip or increased dimpling just above the knee. This motion will push the back of the knee down toward the floor/mat/bed on which you are lying.  Hold the muscle as tight as you can without increasing your pain for __________ seconds.  Relax the muscles slowly and completely in between each repetition. Repeat __________ times. Complete this exercise __________ times per day.  STRENGTH - Quadriceps, Short Arcs   Lie on your back. Place a __________ inch towel roll under your knee so that the knee slightly bends.  Raise only your lower leg by tightening the muscles in the front of your thigh. Do not allow your thigh to rise.  Hold this position for __________ seconds. Repeat __________ times. Complete this exercise __________ times per day.  OPTIONAL ANKLE WEIGHTS: Begin with ____________________, but DO NOT exceed ____________________. Increase in1 lb/0.5 kg increments.  STRENGTH - Quadriceps, Straight Leg Raises  Quality counts! Watch for signs that the quadriceps muscle is working to insure you are strengthening the correct muscles and not "cheating" by substituting with healthier muscles.  Lay on your back with your right / left leg extended and your opposite knee bent.  Tense the muscles in the front of your right / left thigh. You should see either your knee cap slide up or increased dimpling just above the knee. Your thigh may even quiver.  Tighten these muscles even more and raise your leg 4 to 6 inches off the floor. Hold for __________ seconds.  Keeping these muscles tense, lower your leg.  Relax the muscles slowly and completely in between each repetition. Repeat __________ times. Complete this exercise __________ times per day.   STRENGTH - Quadriceps, Step-Ups   Use a thick book, step or step stool that is __________ inches tall.  Holding a wall or counter for balance only, not support.  Slowly step-up with your right / left foot, keeping your knee in line with your hip and foot. Do not allow your knee to bend so far that you cannot see your toes.  Slowly unlock your knee and lower yourself to the starting position. Your muscles, not gravity, should lower you. Repeat __________ times. Complete this exercise __________ times per day.  STRENGTH - Quadriceps, Wall Slides  Follow guidelines for form closely. Increased knee pain often results from poorly placed feet or knees.  Lean against a smooth wall or door and walk your feet out 18-24 inches. Place your feet hip-width apart.  Slowly slide down the wall or door until your knees bend __________ degrees.* Keep your knees over your heels, not your toes, and in line with your hips, not falling to either side.  Hold for __________ seconds. Stand up to rest for __________ seconds in between each repetition. Repeat __________ times. Complete this exercise __________   times per day. * Your physician, physical therapist or athletic trainer will alter this angle based on your symptoms and progress.   This information is not intended to replace advice given to you by your health care provider. Make sure you discuss any questions you have with your health care provider.   Document Released: 02/18/2005 Document Revised: 07/05/2014 Document Reviewed: 06/02/2008 Elsevier Interactive Patient Education 2016 [employer]  

## 2015-02-17 NOTE — Progress Notes (Signed)
Sheri Allen  MRN: [id number] DOB: [birth year]  Subjective:  Pt presents to clinic with left knee pain - she has 2 types of pain in her knee.  The medial knee joint pain has been there for about 3-4 months and it is intermittent though recently has been bothering her more often.  When the pain is bad she takes Aleve and that helps a little.  She did not have a specific injury that she can think of but she does wear shoes that cause her to roll her ankle and she wonders if that causes the flair ups. She gets clicking and popping her her knee but no giving away.  [facility] and the pain increased slightly and then over the last several days she has noticed that now she has pain above her patella that got worse when she twice in the past week has stepped on something causing her to slip and twist.  The pain is worse than her other medial knee pain - occurs whenever she bends her knee or tries to lift her leg.  She would also like refills of her asthma/cold medications.  She is feeling like she is getting a cold and she wants to be prepared and her medications are expired.  Patient Active Problem List   Diagnosis Date Noted  . Nicotine addiction 04/28/2013  . RAD (reactive airway disease) 04/28/2013  . AR (allergic rhinitis) 04/28/2013  . Insomnia 01/16/2012  . Adjustment reaction 01/16/2012    Current Outpatient Prescriptions on File Prior to Visit  Medication Sig Dispense Refill  . clonazePAM (KLONOPIN) 0.5 MG tablet Take 1 tablet (0.5 mg total) by mouth at bedtime as needed. 30 tablet 2  . escitalopram (LEXAPRO) 20 MG tablet Take 1 tablet (20 mg total) by mouth daily. 90 tablet 1  . fexofenadine (ALLEGRA) 180 MG tablet Take 1 tablet (180 mg total) by mouth daily. 90 tablet 3  . montelukast (SINGULAIR) 10 MG tablet Take 1 tablet (10 mg total) by mouth at bedtime. 90 tablet 3  . norgestimate-ethinyl estradiol (ORTHO-CYCLEN,SPRINTEC,PREVIFEM)  0.25-35 MG-MCG tablet Take 1 tablet by mouth daily.    . valACYclovir (VALTREX) 1000 MG tablet 1/2 po QD for suppression, 1/2 PO bid for 3 days for each outbreak 50 tablet 3   No current facility-administered medications on file prior to visit.    Allergies  Allergen Reactions  . Amoxicillin Hives  . Hydrocodone Nausea And Vomiting  . Miconazole Nitrate Hives    Review of Systems  Musculoskeletal: Positive for gait problem (2nd to pain). Negative for joint swelling.   Objective:  BP 108/68 mmHg  Pulse 87  Temp(Src) 98.8 F (37.1 C) (Oral)  Resp 14  SpO2 99%  Physical Exam  Constitutional: She is oriented to person, place, and time and well-developed, well-nourished, and in no distress.  HENT:  Head: Normocephalic and atraumatic.  Right Ear: Hearing and external ear normal.  Left Ear: Hearing and external ear normal.  Eyes: Conjunctivae are normal.  Neck: Normal range of motion.  Pulmonary/Chest: Effort normal.  Musculoskeletal:       Right knee: Normal.       Left knee: She exhibits no swelling, no deformity, normal alignment, normal patellar mobility, no bony tenderness and no MCL laxity. Tenderness found. Medial joint line (TTP) tenderness noted. No lateral joint line, no MCL and no LCL tenderness noted.       Legs: Any use of the quad muscle  causes her pain superior to the patella - she has weakness 2nd to pain.  [place]rays causes a clicking with external hip rotation and knee extension but this exam is difficult due to her superior patella pain.  Neurological: She is alert and oriented to person, place, and time. Gait normal.  Skin: Skin is warm and dry.  Psychiatric: Mood, memory, affect and judgment normal.  Vitals reviewed.   Assessment and Plan :  Mild intermittent asthma, uncomplicated - Plan: mometasone (NASONEX) 50 MCG/ACT nasal spray, beclomethasone (QVAR) 80 MCG/ACT inhaler, albuterol (PROVENTIL HFA;VENTOLIN HFA) 108 (90 BASE) MCG/ACT inhaler  Wheezing -  Plan: ipratropium (ATROVENT) 0.03 % nasal spray  Left knee pain - Plan: diclofenac (VOLTAREN) 75 MG EC tablet, Ambulatory referral to Orthopedic Surgery - I suspect the patient has a medial meniscal tear and we will send to ortho for an [place] for possible diagnosis instead of MRI for cost purposes.  They will order the MRI if that feel that is necessary.  Quadriceps tendonitis - Plan: diclofenac (VOLTAREN) 75 MG EC tablet, Ambulatory referral to Orthopedic Surgery - Rest and ice to the area - expect this to resolve with time.  Pt will try not to engage the muscle is exercise fashion but obvious the patient still needs to move.    Benny Lennert PA-C  [facility] 02/17/2015 1:45 PM
